# Patient Record
Sex: Male | Born: 1985 | Race: White | Hispanic: No | Marital: Single | Smoking: Current every day smoker
Health system: Southern US, Community
[De-identification: ages and names within clinical notes are randomized; demographics above are authoritative.]

---

## 2019-05-07 ENCOUNTER — Inpatient Hospital Stay (HOSPITAL_COMMUNITY)
Admission: EM | Admit: 2019-05-07 | Discharge: 2019-05-22 | DRG: 394 | Attending: Internal Medicine | Admitting: Internal Medicine

## 2019-05-07 ENCOUNTER — Emergency Department (HOSPITAL_COMMUNITY)

## 2019-05-07 ENCOUNTER — Encounter (HOSPITAL_COMMUNITY): Payer: Self-pay | Admitting: Emergency Medicine

## 2019-05-07 ENCOUNTER — Inpatient Hospital Stay (HOSPITAL_COMMUNITY)

## 2019-05-07 ENCOUNTER — Encounter (HOSPITAL_COMMUNITY): Admission: EM | Payer: Self-pay | Attending: Internal Medicine

## 2019-05-07 ENCOUNTER — Emergency Department (HOSPITAL_COMMUNITY): Admitting: Registered Nurse

## 2019-05-07 ENCOUNTER — Other Ambulatory Visit: Payer: Self-pay

## 2019-05-07 DIAGNOSIS — X58XXXA Exposure to other specified factors, initial encounter: Secondary | ICD-10-CM | POA: Diagnosis present

## 2019-05-07 DIAGNOSIS — K5900 Constipation, unspecified: Secondary | ICD-10-CM | POA: Diagnosis present

## 2019-05-07 DIAGNOSIS — F515 Nightmare disorder: Secondary | ICD-10-CM | POA: Diagnosis present

## 2019-05-07 DIAGNOSIS — Z781 Physical restraint status: Secondary | ICD-10-CM | POA: Diagnosis not present

## 2019-05-07 DIAGNOSIS — Z915 Personal history of self-harm: Secondary | ICD-10-CM

## 2019-05-07 DIAGNOSIS — F17210 Nicotine dependence, cigarettes, uncomplicated: Secondary | ICD-10-CM | POA: Diagnosis present

## 2019-05-07 DIAGNOSIS — S51812A Laceration without foreign body of left forearm, initial encounter: Secondary | ICD-10-CM | POA: Diagnosis present

## 2019-05-07 DIAGNOSIS — T1491XA Suicide attempt, initial encounter: Secondary | ICD-10-CM | POA: Diagnosis not present

## 2019-05-07 DIAGNOSIS — T184XXA Foreign body in colon, initial encounter: Principal | ICD-10-CM | POA: Diagnosis present

## 2019-05-07 DIAGNOSIS — Z62810 Personal history of physical and sexual abuse in childhood: Secondary | ICD-10-CM | POA: Diagnosis present

## 2019-05-07 DIAGNOSIS — F4310 Post-traumatic stress disorder, unspecified: Secondary | ICD-10-CM | POA: Diagnosis present

## 2019-05-07 DIAGNOSIS — Y92149 Unspecified place in prison as the place of occurrence of the external cause: Secondary | ICD-10-CM | POA: Diagnosis not present

## 2019-05-07 DIAGNOSIS — B1920 Unspecified viral hepatitis C without hepatic coma: Secondary | ICD-10-CM | POA: Diagnosis present

## 2019-05-07 DIAGNOSIS — F331 Major depressive disorder, recurrent, moderate: Secondary | ICD-10-CM | POA: Diagnosis present

## 2019-05-07 DIAGNOSIS — Z20828 Contact with and (suspected) exposure to other viral communicable diseases: Secondary | ICD-10-CM | POA: Diagnosis present

## 2019-05-07 DIAGNOSIS — T17908S Unspecified foreign body in respiratory tract, part unspecified causing other injury, sequela: Secondary | ICD-10-CM

## 2019-05-07 DIAGNOSIS — X788XXA Intentional self-harm by other sharp object, initial encounter: Secondary | ICD-10-CM | POA: Diagnosis present

## 2019-05-07 DIAGNOSIS — R45851 Suicidal ideations: Secondary | ICD-10-CM | POA: Diagnosis present

## 2019-05-07 DIAGNOSIS — F4312 Post-traumatic stress disorder, chronic: Secondary | ICD-10-CM | POA: Diagnosis not present

## 2019-05-07 DIAGNOSIS — T17900S Unspecified foreign body in respiratory tract, part unspecified causing asphyxiation, sequela: Secondary | ICD-10-CM

## 2019-05-07 DIAGNOSIS — T189XXD Foreign body of alimentary tract, part unspecified, subsequent encounter: Secondary | ICD-10-CM | POA: Diagnosis not present

## 2019-05-07 DIAGNOSIS — Z880 Allergy status to penicillin: Secondary | ICD-10-CM

## 2019-05-07 DIAGNOSIS — T189XXA Foreign body of alimentary tract, part unspecified, initial encounter: Secondary | ICD-10-CM | POA: Diagnosis present

## 2019-05-07 LAB — CBC
HCT: 49.7 % (ref 39.0–52.0)
Hemoglobin: 16.2 g/dL (ref 13.0–17.0)
MCH: 30.1 pg (ref 26.0–34.0)
MCHC: 32.6 g/dL (ref 30.0–36.0)
MCV: 92.2 fL (ref 80.0–100.0)
Platelets: 311 10*3/uL (ref 150–400)
RBC: 5.39 MIL/uL (ref 4.22–5.81)
RDW: 13 % (ref 11.5–15.5)
WBC: 9.4 10*3/uL (ref 4.0–10.5)
nRBC: 0 % (ref 0.0–0.2)

## 2019-05-07 LAB — RAPID URINE DRUG SCREEN, HOSP PERFORMED
Amphetamines: NOT DETECTED
Barbiturates: NOT DETECTED
Benzodiazepines: NOT DETECTED
Cocaine: NOT DETECTED
Opiates: NOT DETECTED
Tetrahydrocannabinol: NOT DETECTED

## 2019-05-07 LAB — COMPREHENSIVE METABOLIC PANEL
ALT: 11 U/L (ref 0–44)
AST: 15 U/L (ref 15–41)
Albumin: 4.4 g/dL (ref 3.5–5.0)
Alkaline Phosphatase: 67 U/L (ref 38–126)
Anion gap: 8 (ref 5–15)
BUN: 7 mg/dL (ref 6–20)
CO2: 29 mmol/L (ref 22–32)
Calcium: 8.9 mg/dL (ref 8.9–10.3)
Chloride: 103 mmol/L (ref 98–111)
Creatinine, Ser: 0.84 mg/dL (ref 0.61–1.24)
GFR calc Af Amer: >60 mL/min (ref >60)
GFR calc non Af Amer: >60 mL/min (ref >60)
Glucose, Bld: 102 mg/dL — ABNORMAL HIGH (ref 70–99)
Potassium: 4.5 mmol/L (ref 3.5–5.1)
Sodium: 140 mmol/L (ref 135–145)
Total Bilirubin: 1 mg/dL (ref 0.3–1.2)
Total Protein: 7.2 g/dL (ref 6.5–8.1)

## 2019-05-07 LAB — ETHANOL: Alcohol, Ethyl (B): <10 mg/dL (ref <10)

## 2019-05-07 LAB — HIV ANTIBODY (ROUTINE TESTING W REFLEX): HIV Screen 4th Generation wRfx: NONREACTIVE

## 2019-05-07 LAB — SALICYLATE LEVEL: Salicylate Lvl: <7 mg/dL (ref 2.8–30.0)

## 2019-05-07 LAB — ACETAMINOPHEN LEVEL: Acetaminophen (Tylenol), Serum: <10 ug/mL — ABNORMAL LOW (ref 10–30)

## 2019-05-07 LAB — SARS CORONAVIRUS 2 BY RT PCR (HOSPITAL ORDER, PERFORMED IN ~~LOC~~ HOSPITAL LAB): SARS Coronavirus 2: NEGATIVE

## 2019-05-07 SURGERY — CANCELLED PROCEDURE
Laterality: Left

## 2019-05-07 MED ORDER — LACTULOSE 10 GM/15ML PO SOLN: 30.0000 g | Freq: Once | ORAL | Status: DC

## 2019-05-07 MED ORDER — POLYETHYLENE GLYCOL 3350 17 G PO PACK
17.0000 g | PACK | Freq: Once | ORAL | Status: AC
  Administered 2019-05-08: 17 g via ORAL
  Filled 2019-05-07 (×3): qty 1

## 2019-05-07 MED ORDER — OXYCODONE HCL 5 MG PO TABS
5.0000 mg | ORAL_TABLET | ORAL | Status: DC | PRN
  Administered 2019-05-08 – 2019-05-22 (×32): 5 mg via ORAL
  Filled 2019-05-07 (×36): qty 1

## 2019-05-07 MED ORDER — BISACODYL 5 MG PO TBEC: 10.0000 mg | DELAYED_RELEASE_TABLET | Freq: Every day | ORAL | Status: DC

## 2019-05-07 MED ORDER — HYDROMORPHONE HCL 1 MG/ML IJ SOLN
0.5000 mg | Freq: Once | INTRAMUSCULAR | Status: AC
  Administered 2019-05-07: 0.5 mg via INTRAVENOUS
  Filled 2019-05-07: qty 1

## 2019-05-07 MED ORDER — SODIUM CHLORIDE 0.9 % IV SOLN
INTRAVENOUS | Status: DC
  Administered 2019-05-07 – 2019-05-14 (×8): via INTRAVENOUS

## 2019-05-07 MED ORDER — KETOROLAC TROMETHAMINE 15 MG/ML IJ SOLN
15.0000 mg | Freq: Four times a day (QID) | INTRAMUSCULAR | Status: AC | PRN
  Administered 2019-05-07 – 2019-05-12 (×5): 15 mg via INTRAVENOUS
  Filled 2019-05-07 (×5): qty 1

## 2019-05-07 SURGICAL SUPPLY — 14 items

## 2019-05-07 NOTE — H&P (Signed)
History and Physical    Christopher Stephenson OXB:[id number] DOB: 12-11-1985 DOA: 05/07/2019  PCP: Patient, No Pcp Per Patient coming from: [place]  Chief Complaint: Ingested razor blades  HPI: Christopher Stephenson is a 33 y.o. male with medical history significant of hepatitis C was brought in by law enforcement for in tensional ingestion of razor blades.  He did this to harm himself he has had multiple suicide attempts previously.  He feels complaints of depression and he just wanted to kill himself.  He has had hospital admissions for similar presentation.  He swallowed 4 razor blades this afternoon that were in the pack.  He has not been eating or drinking much in an attempt to kill himself.  He had a bowel movement today he has some abdominal pain he denies any nausea or vomiting.  He denies urinary complaints chest pain shortness of breath fever chills cough.    ED Course: Rapid Covid test was ordered. ED physician spoke with Dr. Ricky Stabs and Dr. Dulce Sellar. Dr. Dulce Sellar recommended to repeat abdominal x-ray around 7 PM today to confirm where the razor blade is.  He seems to think since he ingested razor blades around 1 PM the blame blade may have already passed the pylorus.Dr. Ricky Stabs recommended laxatives His labs in the ED are unremarkable alcohol level less than 10 salicylate less than 7 CBC and BMP is unremarkable. KUB 3 linear metallic densities in the abdomen 1 of which projects over the stomach and 2 other in the left lower quadrant likely within the bowel.  No evidence of free air. Chest x-ray no acute process   Review of Systems: As per HPI otherwise all other systems reviewed and are negative  Ambulatory Status: Ambulatory at baseline  History reviewed. No pertinent past medical history.  History reviewed. No pertinent surgical history.  Social History   Socioeconomic History  . Marital status: Single    Spouse name: Not on file  . Number of children: Not on file  . Years of education: Not on  file  . Highest education level: Not on file  Occupational History  . Not on file  Social Needs  . Financial resource strain: Not on file  . Food insecurity    Worry: Not on file    Inability: Not on file  . Transportation needs    Medical: Not on file    Non-medical: Not on file  Tobacco Use  . Smoking status: Not on file  Substance and Sexual Activity  . Alcohol use: Not on file  . Drug use: Not on file  . Sexual activity: Not on file  Lifestyle  . Physical activity    Days per week: Not on file    Minutes per session: Not on file  . Stress: Not on file  Relationships  . Social Musician on phone: Not on file    Gets together: Not on file    Attends religious service: Not on file    Active member of club or organization: Not on file    Attends meetings of clubs or organizations: Not on file    Relationship status: Not on file  . Intimate partner violence    Fear of current or ex partner: Not on file    Emotionally abused: Not on file    Physically abused: Not on file    Forced sexual activity: Not on file  Other Topics Concern  . Not on file  Social History Narrative  . Not on  file    Allergies  Allergen Reactions  . Penicillins Other (See Comments)    Unsure Did it involve swelling of the face/tongue/throat, SOB, or low BP? unknown Did it involve sudden or severe rash/hives, skin peeling, or any reaction on the inside of your mouth or nose? unknown Did you need to seek medical attention at a hospital or doctor's office? unknown When did it last happen?childhood allergy If all above answers are "NO", may proceed with cephalosporin use.     No family history on file.    Prior to Admission medications   Not on File    Physical Exam: Vitals:   05/07/19 1449  BP: (!) 146/101  Pulse: 72  Resp: 18  Temp: 98 F (36.7 C)  TempSrc: Oral  SpO2: 99%     . General: Appears calm and comfortable . Eyes:  PERRL, EOMI, normal lids, iris .  ENT:  grossly normal hearing, lips & tongue, mmm . Neck:  no LAD, masses or thyromegaly . Cardiovascular:  RRR, no m/r/g. No LE edema.  Marland Kitchen. Respiratory:  CTA bilaterally, no w/r/r. Normal respiratory effort. . Abdomen:  soft, nd, NABS mild tenderness . Skin:  no rash or induration seen on limited exam . Musculoskeletal:  grossly normal tone BUE/BLE, good ROM, no bony abnormality . Psychiatric:  grossly normal mood and affect, speech fluent and appropriate, AOx3 . Neurologic:  CN 2-12 grossly intact, moves all extremities in coordinated fashion, sensation intact  Labs on Admission: I have personally reviewed following labs and imaging studies  CBC: Recent Labs  Lab 05/07/19 1548  WBC 9.4  HGB 16.2  HCT 49.7  MCV 92.2  PLT 311   Basic Metabolic Panel: Recent Labs  Lab 05/07/19 1548  NA 140  K 4.5  CL 103  CO2 29  GLUCOSE 102*  BUN 7  CREATININE 0.84  CALCIUM 8.9   GFR: CrCl cannot be calculated (Unknown ideal weight.). Liver Function Tests: Recent Labs  Lab 05/07/19 1548  AST 15  ALT 11  ALKPHOS 67  BILITOT 1.0  PROT 7.2  ALBUMIN 4.4   No results for input(s): LIPASE, AMYLASE in the last 168 hours. No results for input(s): AMMONIA in the last 168 hours. Coagulation Profile: No results for input(s): INR, PROTIME in the last 168 hours. Cardiac Enzymes: No results for input(s): CKTOTAL, CKMB, CKMBINDEX, TROPONINI in the last 168 hours. BNP (last 3 results) No results for input(s): PROBNP in the last 8760 hours. HbA1C: No results for input(s): HGBA1C in the last 72 hours. CBG: No results for input(s): GLUCAP in the last 168 hours. Lipid Profile: No results for input(s): CHOL, HDL, LDLCALC, TRIG, CHOLHDL, LDLDIRECT in the last 72 hours. Thyroid Function Tests: No results for input(s): TSH, T4TOTAL, FREET4, T3FREE, THYROIDAB in the last 72 hours. Anemia Panel: No results for input(s): VITAMINB12, FOLATE, FERRITIN, TIBC, IRON, RETICCTPCT in the last 72 hours.  Urine analysis: No results found for: COLORURINE, APPEARANCEUR, LABSPEC, PHURINE, GLUCOSEU, HGBUR, BILIRUBINUR, KETONESUR, PROTEINUR, UROBILINOGEN, NITRITE, LEUKOCYTESUR  Creatinine Clearance: CrCl cannot be calculated (Unknown ideal weight.).  Sepsis Labs: @LABRCNTIP (procalcitonin:4,lacticidven:4) )No results found for this or any previous visit (from the past 240 hour(s)).   Radiological Exams on Admission: Dg Chest 2 View  Result Date: 05/07/2019 CLINICAL DATA:  Patient swallowed a razor blade of approximately 4 pieces today at approximately 12 or 1 p.m. EXAM: CHEST - 2 VIEW COMPARISON:  None. FINDINGS: The heart size and mediastinal contours are within normal limits. The lungs are clear.  No pneumothorax or pleural effusion. No radiopaque foreign body in the chest. The visualized skeletal structures are unremarkable. IMPRESSION: No active cardiopulmonary disease. No radiopaque foreign body in the chest. Electronically Signed   By: Audie Pinto M.D.   On: 05/07/2019 15:44   Dg Abdomen 1 View  Result Date: 05/07/2019 CLINICAL DATA:  Patient swallowed a razor blade of approximately 4 pieces today at approximately 12 or 1 p.m. EXAM: ABDOMEN - 1 VIEW COMPARISON:  None. FINDINGS: There are 3 linear metallic densities in the abdomen, 1 of which projects over the stomach and the other 2 in the left lower quadrant, likely within bowel which most likely represent the ingested contents. There are no dilated loops of bowel. No evidence of free air. No acute finding in the visualized skeleton. IMPRESSION: 3 linear metallic densities in the abdomen, 1 of which projects over the stomach and the other 2 in the left lower quadrant, likely within bowel. These most likely represent the ingested razor blade pieces. No evidence of free air. Electronically Signed   By: Audie Pinto M.D.   On: 05/07/2019 15:47     Assessment/Plan Active Problems:   Ingestion of foreign body  #1 ingestion of razor  blades intentionally to harm himself-admit to MedSurg, start IV fluids, n.p.o. ED physician has consulted Dr. Paulita Fujita and Dr. Harlow Asa Will do KUB today around 7 PM to see where the foreign body is.  #2 depression suicidal intention patient IVC is in the ER.  Will need psych consult once medically cleared.   There is no height or weight on file to calculate BMI.   DVT prophylaxis SCD as patient might need surgery or EGD Code Status: Full code Family Communication: None Disposition Plan: Pending clinical improvement Consults called: GI and general surgery Admission status: Inpatient   Georgette Shell MD [facility]  If 7PM-7AM, please contact night-coverage [email or web address] Password [facility]  05/07/2019, 5:56 PM

## 2019-05-07 NOTE — ED Notes (Signed)
ED TO INPATIENT HANDOFF REPORT  ED Nurse Name and Phone #:  Sahana Boyland [phone]  S Name/Age/Gender Christopher Stephenson 33 y.o. male Room/Bed: WA31/WA31  Code Status   Code Status: Full Code  Home/SNF/Other JAIL Patient oriented to: self, place, time and situation Is this baseline? Yes   Triage Complete: Triage complete  Chief Complaint Ingested Razor Blade / Laceration on Forearm   Triage Note No notes on file   Allergies Allergies  Allergen Reactions  . Penicillins Other (See Comments)    Unsure Did it involve swelling of the face/tongue/throat, SOB, or low BP? unknown Did it involve sudden or severe rash/hives, skin peeling, or any reaction on the inside of your mouth or nose? unknown Did you need to seek medical attention at a hospital or doctor's office? unknown When did it last happen?childhood allergy If all above answers are "NO", may proceed with cephalosporin use.     Level of Care/Admitting Diagnosis ED Disposition    ED Disposition Condition Comment   Admit  Hospital Area: [facility] [[id number]]  Level of Care: Med-Surg [16]  Covid Evaluation: Asymptomatic Screening Protocol (No Symptoms)  Diagnosis: Ingestion of foreign body [708996]  Admitting Physician: Alwyn Ren [[id number]]  Attending Physician: Alwyn Ren [[id number]]  Estimated length of stay: past midnight tomorrow  Certification:: I certify this patient will need inpatient services for at least 2 midnights  PT Class (Do Not Modify): Inpatient [101]  PT Acc Code (Do Not Modify): Private [1]       B Medical/Surgery History History reviewed. No pertinent past medical history. History reviewed. No pertinent surgical history.   A IV Location/Drains/Wounds Patient Lines/Drains/Airways Status   Active Line/Drains/Airways    Name:   Placement date:   Placement time:   Site:   Days:   Peripheral IV 05/07/19 Right Antecubital   05/07/19    1818    Antecubital   less  than 1          Intake/Output Last 24 hours No intake or output data in the 24 hours ending 05/07/19 1845  Labs/Imaging Results for orders placed or performed during the hospital encounter of 05/07/19 (from the past 48 hour(s))  Comprehensive metabolic panel     Status: Abnormal   Collection Time: 05/07/19  3:48 PM  Result Value Ref Range   Sodium 140 135 - 145 mmol/L   Potassium 4.5 3.5 - 5.1 mmol/L   Chloride 103 98 - 111 mmol/L   CO2 29 22 - 32 mmol/L   Glucose, Bld 102 (H) 70 - 99 mg/dL   BUN 7 6 - 20 mg/dL   Creatinine, Ser 0.62 0.61 - 1.24 mg/dL   Calcium 8.9 8.9 - 37.6 mg/dL   Total Protein 7.2 6.5 - 8.1 g/dL   Albumin 4.4 3.5 - 5.0 g/dL   AST 15 15 - 41 U/L   ALT 11 0 - 44 U/L   Alkaline Phosphatase 67 38 - 126 U/L   Total Bilirubin 1.0 0.3 - 1.2 mg/dL   GFR calc non Af Amer >60 >60 mL/min   GFR calc Af Amer >60 >60 mL/min   Anion gap 8 5 - 15    Comment: Performed at [facility], [address], [place], [place] [address]  Ethanol     Status: None   Collection Time: 05/07/19  3:48 PM  Result Value Ref Range   Alcohol, Ethyl (B) <10 <10 mg/dL    Comment: (NOTE) Lowest detectable limit for  serum alcohol is 10 mg/dL. For medical purposes only. Performed at [facility], [address] [place], [place] [address]   Salicylate level     Status: None   Collection Time: 05/07/19  3:48 PM  Result Value Ref Range   Salicylate Lvl <7.0 2.8 - 30.0 mg/dL    Comment: Performed at [facility], [address] [place], [place] [address]  Acetaminophen level     Status: Abnormal   Collection Time: 05/07/19  3:48 PM  Result Value Ref Range   Acetaminophen (Tylenol), Serum <10 (L) 10 - 30 ug/mL    Comment: (NOTE) Therapeutic concentrations vary significantly. A range of 10-30 ug/mL  may be an effective concentration for many patients. However, some  are best treated at concentrations outside of this  range. Acetaminophen concentrations >150 ug/mL at 4 hours after ingestion  and >50 ug/mL at 12 hours after ingestion are often associated with  toxic reactions. Performed at [facility], [address] [place], [place] [address]   cbc     Status: None   Collection Time: 05/07/19  3:48 PM  Result Value Ref Range   WBC 9.4 4.0 - 10.5 K/uL   RBC 5.39 4.22 - 5.81 MIL/uL   Hemoglobin 16.2 13.0 - 17.0 g/dL   HCT 91.449.7 78.239.0 - 95.652.0 %   MCV 92.2 80.0 - 100.0 fL   MCH 30.1 26.0 - 34.0 pg   MCHC 32.6 30.0 - 36.0 g/dL   RDW 21.313.0 08.611.5 - 57.815.5 %   Platelets 311 150 - 400 K/uL   nRBC 0.0 0.0 - 0.2 %    Comment: Performed at [facility], [address] [place], [place] [address]  Rapid urine drug screen (hospital performed)     Status: None   Collection Time: 05/07/19  3:48 PM  Result Value Ref Range   Opiates NONE DETECTED NONE DETECTED   Cocaine NONE DETECTED NONE DETECTED   Benzodiazepines NONE DETECTED NONE DETECTED   Amphetamines NONE DETECTED NONE DETECTED   Tetrahydrocannabinol NONE DETECTED NONE DETECTED   Barbiturates NONE DETECTED NONE DETECTED    Comment: (NOTE) DRUG SCREEN FOR MEDICAL PURPOSES ONLY.  IF CONFIRMATION IS NEEDED FOR ANY PURPOSE, NOTIFY LAB WITHIN 5 DAYS. LOWEST DETECTABLE LIMITS FOR URINE DRUG SCREEN Drug Class                     Cutoff (ng/mL) Amphetamine and metabolites    1000 Barbiturate and metabolites    200 Benzodiazepine                 200 Tricyclics and metabolites     300 Opiates and metabolites        300 Cocaine and metabolites        300 THC                            50 Performed at [facility], [address] [place], [place] [address]   SARS Coronavirus 2 by RT PCR (hospital order, performed in [facility] hospital lab) Nasopharyngeal Nasopharyngeal Swab     Status: None   Collection Time: 05/07/19  5:44 PM   Specimen: Nasopharyngeal Swab  Result Value Ref Range   SARS  Coronavirus 2 NEGATIVE NEGATIVE    Comment: (NOTE) If result is NEGATIVE SARS-CoV-2 target nucleic acids are NOT DETECTED. The SARS-CoV-2 RNA is generally detectable in upper and  lower  respiratory specimens during the acute phase of infection. The lowest  concentration of SARS-CoV-2 viral copies this assay can detect is 250  copies / mL. A negative result does not preclude SARS-CoV-2 infection  and should not be used as the sole basis for treatment or other  patient management decisions.  A negative result may occur with  improper specimen collection / handling, submission of specimen other  than nasopharyngeal swab, presence of viral mutation(s) within the  areas targeted by this assay, and inadequate number of viral copies  (<250 copies / mL). A negative result must be combined with clinical  observations, patient history, and epidemiological information. If result is POSITIVE SARS-CoV-2 target nucleic acids are DETECTED. The SARS-CoV-2 RNA is generally detectable in upper and lower  respiratory specimens dur ing the acute phase of infection.  Positive  results are indicative of active infection with SARS-CoV-2.  Clinical  correlation with patient history and other diagnostic information is  necessary to determine patient infection status.  Positive results do  not rule out bacterial infection or co-infection with other viruses. If result is PRESUMPTIVE POSTIVE SARS-CoV-2 nucleic acids MAY BE PRESENT.   A presumptive positive result was obtained on the submitted specimen  and confirmed on repeat testing.  While 2019 novel coronavirus  (SARS-CoV-2) nucleic acids may be present in the submitted sample  additional confirmatory testing may be necessary for epidemiological  and / or clinical management purposes  to differentiate between  SARS-CoV-2 and other Sarbecovirus currently known to infect humans.  If clinically indicated additional testing with an alternate test  methodology  [phone]) is advised. The SARS-CoV-2 RNA is generally  detectable in upper and lower respiratory sp ecimens during the acute  phase of infection. The expected result is Negative. Fact Sheet for Patients:  BoilerBrush.com.cy Fact Sheet for Healthcare Providers: https://pope.com/ This test is not yet approved or cleared by the [place] FDA and has been authorized for detection and/or diagnosis of SARS-CoV-2 by FDA under an Emergency Use Authorization (EUA).  This EUA will remain in effect (meaning this test can be used) for the duration of the COVID-19 declaration under Section 564(b)(1) of the Act, 21 U.S.C. section 360bbb-3(b)(1), unless the authorization is terminated or revoked sooner. Performed at [facility], [address], [place], [place] [address]    Dg Chest 2 View  Result Date: 05/07/2019 CLINICAL DATA:  Patient swallowed a razor blade of approximately 4 pieces today at approximately 12 or 1 p.m. EXAM: CHEST - 2 VIEW COMPARISON:  None. FINDINGS: The heart size and mediastinal contours are within normal limits. The lungs are clear. No pneumothorax or pleural effusion. No radiopaque foreign body in the chest. The visualized skeletal structures are unremarkable. IMPRESSION: No active cardiopulmonary disease. No radiopaque foreign body in the chest. Electronically Signed   By: Emmaline Kluver M.D.   On: 05/07/2019 15:44   Dg Abdomen 1 View  Result Date: 05/07/2019 CLINICAL DATA:  Patient swallowed a razor blade of approximately 4 pieces today at approximately 12 or 1 p.m. EXAM: ABDOMEN - 1 VIEW COMPARISON:  None. FINDINGS: There are 3 linear metallic densities in the abdomen, 1 of which projects over the stomach and the other 2 in the left lower quadrant, likely within bowel which most likely represent the ingested contents. There are no dilated loops of bowel. No evidence of free air. No acute finding in the  visualized skeleton. IMPRESSION: 3 linear metallic densities in the abdomen, 1 of which  projects over the stomach and the other 2 in the left lower quadrant, likely within bowel. These most likely represent the ingested razor blade pieces. No evidence of free air. Electronically Signed   By: Audie Pinto M.D.   On: 05/07/2019 15:47    Pending Labs Unresulted Labs (From admission, onward)    Start     Ordered   05/08/19 0500  Comprehensive metabolic panel  Tomorrow morning,   R     05/07/19 1755   05/08/19 0500  CBC  Tomorrow morning,   R     05/07/19 1755   05/07/19 1755  HIV Antibody (routine testing w rflx)  (HIV Antibody (Routine testing w reflex) panel)  Once,   STAT     05/07/19 1755          Vitals/Pain Today's Vitals   05/07/19 1449 05/07/19 1451  BP: (!) 146/101   Pulse: 72   Resp: 18   Temp: 98 F (36.7 C)   TempSrc: Oral   SpO2: 99%   PainSc:  0-No pain    Isolation Precautions No active isolations  Medications Medications  0.9 %  sodium chloride infusion ( Intravenous New Bag/Given 05/07/19 1825)  polyethylene glycol (MIRALAX / GLYCOLAX) packet 17 g (has no administration in time range)  HYDROmorphone (DILAUDID) injection 0.5 mg (0.5 mg Intravenous Given 05/07/19 1842)    Mobility walks Low fall risk   Focused Assessments    R Recommendations: See Admitting Provider Note  Report given to:   Additional Notes:  Do not give miralax prior to surgery. Only give if endo opts for no surgery *

## 2019-05-07 NOTE — Plan of Care (Signed)
[facility] was called about foreign body ingestion in patient.  Endoscopy staff mobilized pending results of COVID testing.  By time COVID testing complete (which is mandatory for all endoscopic procedures), the foreign body in the stomach has subsequently passed into at least the mid-to-distal small bowel, thus endoscopy not warranted.  Advise gentle bowel regimen with miralax and repeat xray tomorrow am.  Patient will need to remain under strict surveillance and restraints, as he is at very high risk for reattempts to swallow further foreign bodies.  [facility] will revisit tomorrow.

## 2019-05-07 NOTE — Progress Notes (Addendum)
Pt refusing to drink Miralax stating that it, "Defeats the purpose of not eating and drinking over the last few days and trying to starve myself."   Also documentation of patient's refusal of blood products if needed documented in the [place].

## 2019-05-07 NOTE — ED Provider Notes (Signed)
[facility]-EMERGENCY DEPT Provider Note   CSN: [id number] Arrival date & time: 05/07/19  1441     History   Chief Complaint Chief Complaint  Patient presents with   Ingestion    HPI Christopher Stephenson is a 33 y.o. male with history of hepatitis C presents from prison accompanied by law enforcement for evaluation after intentional ingestion of razor blades.  He tells me that he is depressed and "I am done, 1 way or another I am done ".  He tells me that he has had 50+ suicide attempts previously.  Per chart review the patient had been admitted to an outside hospital in 2015 for similar presentation.  He tells me around 1 PM today he swallowed 3 or 4 razor blades that were in a pack.  Reports that he has not been eating or drinking as much as well in an attempt to hurt himself stating "if the razor blades do not do it, I figured not eating might".  Denies homicidal ideation or auditory or visual hallucinations.  He notes some right-sided cramping abdominal pain that began around 1 hour ago.  Denies chest pain, shortness of breath, nausea, vomiting, urinary symptoms, diarrhea, or constipation.  He has not had a bowel movement since yesterday.     The history is provided by the patient.    History reviewed. No pertinent past medical history.  Patient Active Problem List   Diagnosis Date Noted   Ingestion of foreign body 05/07/2019    History reviewed. No pertinent surgical history.      Home Medications    Prior to Admission medications   Not on File    Family History No family history on file.  Social History Social History   Tobacco Use   Smoking status: Not on file  Substance Use Topics   Alcohol use: Not on file   Drug use: Not on file     Allergies   Penicillins   Review of Systems Review of Systems  Constitutional: Negative for chills and fever.  Respiratory: Negative for shortness of breath.   Cardiovascular: Negative for chest  pain.  Gastrointestinal: Positive for abdominal pain. Negative for blood in stool, constipation, diarrhea, nausea and vomiting.  Psychiatric/Behavioral: Positive for dysphoric mood and suicidal ideas.  All other systems reviewed and are negative.    Physical Exam Updated Vital Signs BP 127/79 (BP Location: Right Arm)    Pulse 75    Temp 98.1 F (36.7 C) (Oral)    Resp 19    SpO2 99%   Physical Exam Vitals signs and nursing note reviewed.  Constitutional:      General: He is not in acute distress.    Appearance: He is well-developed.  HENT:     Head: Normocephalic and atraumatic.  Eyes:     General:        Right eye: No discharge.        Left eye: No discharge.     Conjunctiva/sclera: Conjunctivae normal.  Neck:     Vascular: No JVD.     Trachea: No tracheal deviation.  Cardiovascular:     Rate and Rhythm: Normal rate and regular rhythm.  Pulmonary:     Effort: Pulmonary effort is normal.     Breath sounds: Normal breath sounds.  Abdominal:     General: Abdomen is flat. Bowel sounds are normal. There is no distension.     Palpations: Abdomen is soft.     Tenderness: There is abdominal tenderness  in the periumbilical area. There is no guarding or rebound.  Skin:    General: Skin is warm and dry.     Findings: No erythema.     Comments: Multiple superficial linear lacerations to the volar aspect of the left forearm appear fresh.  No active bleeding, no subcutaneous tissue noted.  Neurological:     Mental Status: He is alert.  Psychiatric:        Attention and Perception: Attention normal.        Mood and Affect: Affect is flat.        Speech: Speech normal.        Behavior: Behavior is withdrawn. Behavior is cooperative.        Thought Content: Thought content includes suicidal ideation. Thought content does not include homicidal ideation. Thought content includes suicidal plan. Thought content does not include homicidal plan.        Judgment: Judgment is impulsive and  inappropriate.      ED Treatments / Results  Labs (all labs ordered are listed, but only abnormal results are displayed) Labs Reviewed  COMPREHENSIVE METABOLIC PANEL - Abnormal; Notable for the following components:      Result Value   Glucose, Bld 102 (*)    All other components within normal limits  ACETAMINOPHEN LEVEL - Abnormal; Notable for the following components:   Acetaminophen (Tylenol), Serum <10 (*)    All other components within normal limits  SARS CORONAVIRUS 2 BY RT PCR (HOSPITAL ORDER, PERFORMED IN [facility])  MRSA PCR SCREENING  ETHANOL  SALICYLATE LEVEL  CBC  RAPID URINE DRUG SCREEN, HOSP PERFORMED  HIV ANTIBODY (ROUTINE TESTING W REFLEX)  COMPREHENSIVE METABOLIC PANEL  CBC    EKG None  Radiology Dg Chest 2 View  Result Date: 05/07/2019 CLINICAL DATA:  Patient swallowed a razor blade of approximately 4 pieces today at approximately 12 or 1 p.m. EXAM: CHEST - 2 VIEW COMPARISON:  None. FINDINGS: The heart size and mediastinal contours are within normal limits. The lungs are clear. No pneumothorax or pleural effusion. No radiopaque foreign body in the chest. The visualized skeletal structures are unremarkable. IMPRESSION: No active cardiopulmonary disease. No radiopaque foreign body in the chest. Electronically Signed   By: Emmaline Kluver M.D.   On: 05/07/2019 15:44   Dg Abdomen 1 View  Result Date: 05/07/2019 CLINICAL DATA:  Patient swallowed a razor blade of approximately 4 pieces today at approximately 12 or 1 p.m. EXAM: ABDOMEN - 1 VIEW COMPARISON:  None. FINDINGS: There are 3 linear metallic densities in the abdomen, 1 of which projects over the stomach and the other 2 in the left lower quadrant, likely within bowel which most likely represent the ingested contents. There are no dilated loops of bowel. No evidence of free air. No acute finding in the visualized skeleton. IMPRESSION: 3 linear metallic densities in the abdomen, 1 of which  projects over the stomach and the other 2 in the left lower quadrant, likely within bowel. These most likely represent the ingested razor blade pieces. No evidence of free air. Electronically Signed   By: Emmaline Kluver M.D.   On: 05/07/2019 15:47    Procedures Procedures (including critical care time)  Medications Ordered in ED Medications  0.9 %  sodium chloride infusion ( Intravenous New Bag/Given 05/07/19 1825)  oxyCODONE (Oxy IR/ROXICODONE) immediate release tablet 5 mg (has no administration in time range)  ketorolac (TORADOL) 15 MG/ML injection 15 mg (15 mg Intravenous Given 05/07/19 2041)  polyethylene glycol (MIRALAX / GLYCOLAX) packet 17 g (17 g Oral Given 05/07/19 2042)  HYDROmorphone (DILAUDID) injection 0.5 mg (0.5 mg Intravenous Given 05/07/19 1842)     Initial Impression / Assessment and Plan / ED Course  I have reviewed the triage vital signs and the nursing notes.  Pertinent labs & imaging results that were available during my care of the patient were reviewed by me and considered in my medical decision making (see chart for details).        Patient presenting to the ED brought in by law enforcement after reportedly swallowing razor blades at 1 PM.  He is afebrile, vital signs are stable.  He is nontoxic in appearance.  He is verbalizing clear intent to harm himself and states that he is starving himself and swallowed the razor blades in an attempt to commit suicide.  Abdomen soft, mild right lower quadrant tenderness.  Lab work reviewed by me shows no leukocytosis, no anemia, no metabolic derangements, no renal insufficiency.  Plain films of the chest and abdomen show 3 linear metallic densities in the abdomen 1 of which project over the stomach and the other 2 in the left lower quadrant likely within the bowel which likely represent swallowed razor blades.  4:18 PM CONSULT: Spoke with Dr. Harlow Asa with general surgery. Recommends GI consult for further recommendations as  they may want to do EGD to remove the bleed that apparently is in the stomach; advises there is nothing to do from a surgical standpoint but surgery can be consulted if the patient develops acute abdomen.  May want to give laxatives to help the razor blades pass.  4:30PM CONSULT: Spoke with Dr. Paulita Fujita with GI who recommends obtaining rapid Covid test so that the patient can undergo EGD for the razor blade that appears to be in the stomach.  He does recommend admission to the hospitalist service for observation to ensure that the razor blades that are already within the bowel pass.  Spoke with Dr. Johnnye Sima with infectious disease who approves obtaining rapid Covid for preprocedural screening.  5:51 PM Dr. Paulita Fujita called to follow-up on the status of the patient's rapid Covid test.  It is currently in process.  Since patient reportedly ingested the razor blades around 1 PM today, the blade may have already passed the pylorus.  He recommends getting repeat abdominal x-ray at around 7 PM today to confirm placement; will plan to move forward with endoscopy if it is still present on repeat x-ray.  Spoke with Dr. Sid Falcon or with [facility] hospitalist service who agrees to assume care patient and bring the patient into the hospital for further evaluation and management.  Will place patient under IVC for his clear suicidal intent and attempt.  Once he is medically cleared he should undergo psychiatric assessment.  Final Clinical Impressions(s) / ED Diagnoses   Final diagnoses:  Intentional self-harm by razor blade [facility])  Swallowed foreign body, initial encounter    ED Discharge Orders    None       Debroah Baller 05/07/19 2126    Gareth Morgan, MD 05/10/19 1155

## 2019-05-08 ENCOUNTER — Encounter (HOSPITAL_COMMUNITY): Payer: Self-pay | Admitting: *Deleted

## 2019-05-08 ENCOUNTER — Inpatient Hospital Stay (HOSPITAL_COMMUNITY)

## 2019-05-08 DIAGNOSIS — T17908S Unspecified foreign body in respiratory tract, part unspecified causing other injury, sequela: Secondary | ICD-10-CM

## 2019-05-08 DIAGNOSIS — X788XXA Intentional self-harm by other sharp object, initial encounter: Secondary | ICD-10-CM | POA: Diagnosis present

## 2019-05-08 DIAGNOSIS — T17900S Unspecified foreign body in respiratory tract, part unspecified causing asphyxiation, sequela: Secondary | ICD-10-CM

## 2019-05-08 LAB — MRSA PCR SCREENING: MRSA by PCR: POSITIVE — AB

## 2019-05-08 LAB — COMPREHENSIVE METABOLIC PANEL
ALT: 10 U/L (ref 0–44)
AST: 12 U/L — ABNORMAL LOW (ref 15–41)
Albumin: 3.8 g/dL (ref 3.5–5.0)
Alkaline Phosphatase: 61 U/L (ref 38–126)
Anion gap: 7 (ref 5–15)
BUN: 7 mg/dL (ref 6–20)
CO2: 25 mmol/L (ref 22–32)
Calcium: 8.7 mg/dL — ABNORMAL LOW (ref 8.9–10.3)
Chloride: 107 mmol/L (ref 98–111)
Creatinine, Ser: 0.7 mg/dL (ref 0.61–1.24)
GFR calc Af Amer: >60 mL/min (ref >60)
GFR calc non Af Amer: >60 mL/min (ref >60)
Glucose, Bld: 91 mg/dL (ref 70–99)
Potassium: 3.7 mmol/L (ref 3.5–5.1)
Sodium: 139 mmol/L (ref 135–145)
Total Bilirubin: 1.1 mg/dL (ref 0.3–1.2)
Total Protein: 6.4 g/dL — ABNORMAL LOW (ref 6.5–8.1)

## 2019-05-08 LAB — CBC
HCT: 45.3 % (ref 39.0–52.0)
Hemoglobin: 14.8 g/dL (ref 13.0–17.0)
MCH: 30 pg (ref 26.0–34.0)
MCHC: 32.7 g/dL (ref 30.0–36.0)
MCV: 91.7 fL (ref 80.0–100.0)
Platelets: 267 10*3/uL (ref 150–400)
RBC: 4.94 MIL/uL (ref 4.22–5.81)
RDW: 12.9 % (ref 11.5–15.5)
WBC: 7.4 10*3/uL (ref 4.0–10.5)
nRBC: 0 % (ref 0.0–0.2)

## 2019-05-08 MED ORDER — POLYETHYLENE GLYCOL 3350 17 G PO PACK
17.0000 g | PACK | Freq: Every day | ORAL | Status: DC
  Administered 2019-05-08: 17 g via ORAL
  Filled 2019-05-08: qty 1

## 2019-05-08 MED ORDER — CHLORHEXIDINE GLUCONATE CLOTH 2 % EX PADS
6.0000 | MEDICATED_PAD | Freq: Every day | CUTANEOUS | Status: AC
  Administered 2019-05-08 – 2019-05-11 (×2): 6 via TOPICAL

## 2019-05-08 MED ORDER — MUPIROCIN 2 % EX OINT
1.0000 "application " | TOPICAL_OINTMENT | Freq: Two times a day (BID) | CUTANEOUS | Status: AC
  Administered 2019-05-08 – 2019-05-12 (×4): 1 via NASAL
  Filled 2019-05-08 (×2): qty 22

## 2019-05-08 NOTE — Progress Notes (Addendum)
After an attempt to clamp the IV, Pt has now pulled his IV out. He says this is only prolonging him wanting to die and just wants to go back to prison. He is not confused, agitated or upset, he is very calmly declining care. Paged the on Call O[place], NP and made aware of patient's refusals. Psych to evaluate in the am.

## 2019-05-08 NOTE — Progress Notes (Signed)
PROGRESS NOTE    Christopher Stephenson  LEX:[id number] DOB: Nov 01, 1985 DOA: 05/07/2019 PCP: Patient, No Pcp Per    Brief Narrative: 33 y.o. male with medical history significant of hepatitis C was brought in by law enforcement for in tensional ingestion of razor blades.  He did this to harm himself he has had multiple suicide attempts previously.  He feels complaints of depression and he just wanted to kill himself.  He has had hospital admissions for similar presentation.  He swallowed 4 razor blades this afternoon that were in the pack.  He has not been eating or drinking much in an attempt to kill himself.  He had a bowel movement today he has some abdominal pain he denies any nausea or vomiting.  He denies urinary complaints chest pain shortness of breath fever chills cough.    ED Course: Rapid Covid test was ordered. ED physician spoke with Dr. Ricky Stabs and Dr. Dulce Sellar. Dr. Dulce Sellar recommended to repeat abdominal x-ray around 7 PM today to confirm where the razor blade is.  He seems to think since he ingested razor blades around 1 PM the blame blade may have already passed the pylorus.Dr. Ricky Stabs recommended laxatives His labs in the ED are unremarkable alcohol level less than 10 salicylate less than 7 CBC and BMP is unremarkable. KUB 3 linear metallic densities in the abdomen 1 of which projects over the stomach and 2 other in the left lower quadrant likely within the bowel.  No evidence of free air. Chest x-ray no acute process   Assessment & Plan:   Active Problems:   Ingestion of foreign body   #1 ingestion of razor blades intentionally to harm himself-admit to MedSurg, start IV fluids, n.p.o. Patient has not had a bowel movement since admission. He took MiraLAX finally last night. KUB-3 metallic foreign bodies 1 within the left mid abdomen and 2 just to the right of the L5 vertebral body. Follow-up KUB this morning. .  #2 depression suicidal intention patient IVC is in the ER.   Will need psych consult once medically cleared.    There is no height or weight on file to calculate BMI.  DVT prophylaxis SCD as patient might need surgery or EGD Code Status: Full code Family Communication: None Disposition Plan: Pending clinical improvement Consults called: GI and general surgery Admission status: Inpatient  Subjective: Patient denies any nausea vomiting abdominal pain or diarrhea he has not had a bowel movement since admission to hospital.  He refused MiraLAX last night and finally ended up taking it.  Objective: Vitals:   05/07/19 1449 05/07/19 1859 05/07/19 2036 05/08/19 0625  BP: (!) 146/101 (!) 132/93 127/79 104/81  Pulse: 72 72 75 69  Resp: 18 12 19 18   Temp: 98 F (36.7 C) 98.3 F (36.8 C) 98.1 F (36.7 C) 97.8 F (36.6 C)  TempSrc: Oral Oral Oral   SpO2: 99% 99% 99% 100%    Intake/Output Summary (Last 24 hours) at 05/08/2019 1047 Last data filed at 05/08/2019 0600 Gross per 24 hour  Intake 1890 ml  Output 400 ml  Net 1490 ml   There were no vitals filed for this visit.  Examination:  General exam: Appears calm and comfortable  Respiratory system: Clear to auscultation. Respiratory effort normal. Cardiovascular system: S1 & S2 heard, RRR. No JVD, murmurs, rubs, gallops or clicks. No pedal edema. Gastrointestinal system: Abdomen is nondistended, soft and nontender. No organomegaly or masses felt. Normal bowel sounds heard. Central nervous system: Alert and oriented. No  focal neurological deficits. Extremities: Symmetric 5 x 5 power. Skin: No rashes, lesions or ulcers Psychiatry: Judgement and insight appear normal. Mood & affect appropriate.     Data Reviewed: I have personally reviewed following labs and imaging studies  CBC: Recent Labs  Lab 05/07/19 1548 05/08/19 0518  WBC 9.4 7.4  HGB 16.2 14.8  HCT 49.7 45.3  MCV 92.2 91.7  PLT 311 267   Basic Metabolic Panel: Recent Labs  Lab 05/07/19 1548 05/08/19 0518  NA 140 139   K 4.5 3.7  CL 103 107  CO2 29 25  GLUCOSE 102* 91  BUN 7 7  CREATININE 0.84 0.70  CALCIUM 8.9 8.7*   GFR: CrCl cannot be calculated (Unknown ideal weight.). Liver Function Tests: Recent Labs  Lab 05/07/19 1548 05/08/19 0518  AST 15 12*  ALT 11 10  ALKPHOS 67 61  BILITOT 1.0 1.1  PROT 7.2 6.4*  ALBUMIN 4.4 3.8   No results for input(s): LIPASE, AMYLASE in the last 168 hours. No results for input(s): AMMONIA in the last 168 hours. Coagulation Profile: No results for input(s): INR, PROTIME in the last 168 hours. Cardiac Enzymes: No results for input(s): CKTOTAL, CKMB, CKMBINDEX, TROPONINI in the last 168 hours. BNP (last 3 results) No results for input(s): PROBNP in the last 8760 hours. HbA1C: No results for input(s): HGBA1C in the last 72 hours. CBG: No results for input(s): GLUCAP in the last 168 hours. Lipid Profile: No results for input(s): CHOL, HDL, LDLCALC, TRIG, CHOLHDL, LDLDIRECT in the last 72 hours. Thyroid Function Tests: No results for input(s): TSH, T4TOTAL, FREET4, T3FREE, THYROIDAB in the last 72 hours. Anemia Panel: No results for input(s): VITAMINB12, FOLATE, FERRITIN, TIBC, IRON, RETICCTPCT in the last 72 hours. Sepsis Labs: No results for input(s): PROCALCITON, LATICACIDVEN in the last 168 hours.  Recent Results (from the past 240 hour(s))  SARS Coronavirus 2 by RT PCR (hospital order, performed in [facility]) Nasopharyngeal Nasopharyngeal Swab     Status: None   Collection Time: 05/07/19  5:44 PM   Specimen: Nasopharyngeal Swab  Result Value Ref Range Status   SARS Coronavirus 2 NEGATIVE NEGATIVE Final    Comment: (NOTE) If result is NEGATIVE SARS-CoV-2 target nucleic acids are NOT DETECTED. The SARS-CoV-2 RNA is generally detectable in upper and lower  respiratory specimens during the acute phase of infection. The lowest  concentration of SARS-CoV-2 viral copies this assay can detect is 250  copies / mL. A negative result does  not preclude SARS-CoV-2 infection  and should not be used as the sole basis for treatment or other  patient management decisions.  A negative result may occur with  improper specimen collection / handling, submission of specimen other  than nasopharyngeal swab, presence of viral mutation(s) within the  areas targeted by this assay, and inadequate number of viral copies  (<250 copies / mL). A negative result must be combined with clinical  observations, patient history, and epidemiological information. If result is POSITIVE SARS-CoV-2 target nucleic acids are DETECTED. The SARS-CoV-2 RNA is generally detectable in upper and lower  respiratory specimens dur ing the acute phase of infection.  Positive  results are indicative of active infection with SARS-CoV-2.  Clinical  correlation with patient history and other diagnostic information is  necessary to determine patient infection status.  Positive results do  not rule out bacterial infection or co-infection with other viruses. If result is PRESUMPTIVE POSTIVE SARS-CoV-2 nucleic acids MAY BE PRESENT.   A presumptive  positive result was obtained on the submitted specimen  and confirmed on repeat testing.  While 2019 novel coronavirus  (SARS-CoV-2) nucleic acids may be present in the submitted sample  additional confirmatory testing may be necessary for epidemiological  and / or clinical management purposes  to differentiate between  SARS-CoV-2 and other Sarbecovirus currently known to infect humans.  If clinically indicated additional testing with an alternate test  methodology [phone]) is advised. The SARS-CoV-2 RNA is generally  detectable in upper and lower respiratory sp ecimens during the acute  phase of infection. The expected result is Negative. Fact Sheet for Patients:  BoilerBrush.com.cy Fact Sheet for Healthcare Providers: https://pope.com/ This test is not yet approved or  cleared by the [place] FDA and has been authorized for detection and/or diagnosis of SARS-CoV-2 by FDA under an Emergency Use Authorization (EUA).  This EUA will remain in effect (meaning this test can be used) for the duration of the COVID-19 declaration under Section 564(b)(1) of the Act, 21 U.S.C. section 360bbb-3(b)(1), unless the authorization is terminated or revoked sooner. Performed at [facility], [address] [place], [place] [address]   MRSA PCR Screening     Status: Abnormal   Collection Time: 05/07/19  8:27 PM   Specimen: Nasal Mucosa; Nasopharyngeal  Result Value Ref Range Status   MRSA by PCR POSITIVE (A) NEGATIVE Final    Comment:        The GeneXpert MRSA Assay (FDA approved for NASAL specimens only), is one component of a comprehensive MRSA colonization surveillance program. It is not intended to diagnose MRSA infection nor to guide or monitor treatment for MRSA infections. RESULT CALLED TO, READ BACK BY AND VERIFIED WITH: P,ALLEN AT 0157 ON 05/08/19 BY A,MOHAMED Performed at [facility], [address], [place], [place] [address]          Radiology Studies: Dg Chest 2 View  Result Date: 05/07/2019 CLINICAL DATA:  Patient swallowed a razor blade of approximately 4 pieces today at approximately 12 or 1 p.m. EXAM: CHEST - 2 VIEW COMPARISON:  None. FINDINGS: The heart size and mediastinal contours are within normal limits. The lungs are clear. No pneumothorax or pleural effusion. No radiopaque foreign body in the chest. The visualized skeletal structures are unremarkable. IMPRESSION: No active cardiopulmonary disease. No radiopaque foreign body in the chest. Electronically Signed   By: Emmaline Kluver M.D.   On: 05/07/2019 15:44   Dg Abd 1 View  Result Date: 05/07/2019 CLINICAL DATA:  Swallowed razor blades, 4 pieces EXAM: ABDOMEN - 1 VIEW COMPARISON:  May 07, 2019 3:29 p.m. FINDINGS: Again noted are 3  metallic foreign bodies within the mid abdomen measuring approximately 1.7 cm each. 1 now projects over the left mid abdomen likely within bowel loops. Two are seen just to the right of the L5 vertebral body also likely within bowel. No fourth foreign body is identified. The colon is air-filled. No acute osseous abnormality. IMPRESSION: Three metallic foreign bodies consistent with the patient's history of ingestion as described above,, 1 within the left mid abdomen and 2 just to the right of the L5 vertebral body. Electronically Signed   By: Jonna Clark M.D.   On: 05/07/2019 19:34   Dg Abdomen 1 View  Result Date: 05/07/2019 CLINICAL DATA:  Patient swallowed a razor blade of approximately 4 pieces today at approximately 12 or 1 p.m. EXAM: ABDOMEN - 1 VIEW COMPARISON:  None. FINDINGS: There are 3 linear metallic densities in the  abdomen, 1 of which projects over the stomach and the other 2 in the left lower quadrant, likely within bowel which most likely represent the ingested contents. There are no dilated loops of bowel. No evidence of free air. No acute finding in the visualized skeleton. IMPRESSION: 3 linear metallic densities in the abdomen, 1 of which projects over the stomach and the other 2 in the left lower quadrant, likely within bowel. These most likely represent the ingested razor blade pieces. No evidence of free air. Electronically Signed   By: Audie Pinto M.D.   On: 05/07/2019 15:47        Scheduled Meds: . Chlorhexidine Gluconate Cloth  6 each Topical Q0600  . mupirocin ointment  1 application Nasal BID   Continuous Infusions: . sodium chloride 150 mL/hr at 05/08/19 0600     LOS: 1 day     Georgette Shell, MD [facility]  If 7PM-7AM, please contact night-coverage [email or web address] Password [facility] 05/08/2019, 10:47 AM
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
## 2019-05-08 NOTE — Consult Note (Signed)
[facility] Consultation Note  Referring Provider: [facility] Primary Care Physician:  Patient, No Pcp Per  Reason for Consultation:  Ingested foreign body  HPI: Christopher Stephenson is a 33 y.o. male who swallowed a few razor blades.  [employer] inmate, history of prior ingested foreign bodies, swallowed 3-4 razor blades yesterday.  By time he got to ED and had COVID test done, all the razor blades on xray were well out of reach of endoscope.  He has had some trouble with urination.  He has had some mild lower abdominal discomfort near his bladder.  No vomiting or bowel movements.  No blood in stool.   History reviewed. No pertinent past medical history.  History reviewed. No pertinent surgical history.  Prior to Admission medications   Not on File    Current Facility-Administered Medications  Medication Dose Route Frequency Provider Last Rate Last Dose  . 0.9 %  sodium chloride infusion   Intravenous Continuous Alwyn Ren, MD 150 mL/hr at 05/08/19 0600    . Chlorhexidine Gluconate Cloth 2 % PADS 6 each  6 each Topical Q0600 Alwyn Ren, MD   6 each at 05/08/19 1209  . ketorolac (TORADOL) 15 MG/ML injection 15 mg  15 mg Intravenous Q6H PRN Jimmye Norman, NP   15 mg at 05/07/19 2041  . mupirocin ointment (BACTROBAN) 2 % 1 application  1 application Nasal BID Alwyn Ren, MD   1 application at 05/08/19 1209  . oxyCODONE (Oxy IR/ROXICODONE) immediate release tablet 5 mg  5 mg Oral Q4H PRN Jimmye Norman, NP   5 mg at 05/08/19 0325    Allergies as of 05/07/2019 - Review Complete 05/07/2019  Allergen Reaction Noted  . Penicillins Other (See Comments) 08/03/2013    No family history on file.  Social History   Socioeconomic History  . Marital status: Single    Spouse name: Not on file  . Number of children: Not on file  . Years of education: Not on file  . Highest education level: Not on file  Occupational History  . Not on  file  Social Needs  . Financial resource strain: Not on file  . Food insecurity    Worry: Not on file    Inability: Not on file  . Transportation needs    Medical: Not on file    Non-medical: Not on file  Tobacco Use  . Smoking status: Not on file  Substance and Sexual Activity  . Alcohol use: Not on file  . Drug use: Not on file  . Sexual activity: Not on file  Lifestyle  . Physical activity    Days per week: Not on file    Minutes per session: Not on file  . Stress: Not on file  Relationships  . Social Musician on phone: Not on file    Gets together: Not on file    Attends religious service: Not on file    Active member of club or organization: Not on file    Attends meetings of clubs or organizations: Not on file    Relationship status: Not on file  . Intimate partner violence    Fear of current or ex partner: Not on file    Emotionally abused: Not on file    Physically abused: Not on file    Forced sexual activity: Not on file  Other Topics Concern  . Not on file  Social History Narrative  . Not on file  Review of Systems: as per HPI, all others negative  Physical Exam: Vital signs in last 24 hours: Temp:  [97.8 F (36.6 C)-98.3 F (36.8 C)] 97.8 F (36.6 C) (11/08 0625) Pulse Rate:  [69-75] 69 (11/08 0625) Resp:  [12-19] 18 (11/08 0625) BP: (104-146)/(79-101) 104/81 (11/08 0625) SpO2:  [99 %-100 %] 100 % (11/08 0625) Last BM Date: 05/07/19 General:   Alert,  Well-developed, well-nourished, pleasant and cooperative in NAD Head:  Normocephalic and atraumatic. Eyes:  Sclera clear, no icterus.   Conjunctiva pink. Ears:  Normal auditory acuity. Nose:  No deformity, discharge,  or lesions. Mouth:  No deformity or lesions.  Oropharynx pink & moist. Neck:  Supple; no masses or thyromegaly. Lungs:  Clear throughout to auscultation.   No wheezes, crackles, or rhonchi. No acute distress. Heart:  Regular rate and rhythm; no murmurs, clicks, rubs,  or  gallops. Abdomen:  Soft,mild lower abdominal suprapubic tenderness, nondistended. No masses, hepatosplenomegaly or hernias noted. Hypoactive bowel sounds without peritonitis Msk:  Symmetrical without gross deformities. Normal posture. Pulses:  Normal pulses noted. Extremities:  Without clubbing or edema. Neurologic:  Alert and  oriented x4;  grossly normal neurologically. Skin:  Intact without significant lesions or rashes. Psych:  Alert and cooperative. Normal mood and affect.   Lab Results: Recent Labs    05/07/19 1548 05/08/19 0518  WBC 9.4 7.4  HGB 16.2 14.8  HCT 49.7 45.3  PLT 311 267   BMET Recent Labs    05/07/19 1548 05/08/19 0518  NA 140 139  K 4.5 3.7  CL 103 107  CO2 29 25  GLUCOSE 102* 91  BUN 7 7  CREATININE 0.84 0.70  CALCIUM 8.9 8.7*   LFT Recent Labs    05/08/19 0518  PROT 6.4*  ALBUMIN 3.8  AST 12*  ALT 10  ALKPHOS 61  BILITOT 1.1   PT/INR No results for input(s): LABPROT, INR in the last 72 hours.  Studies/Results: Dg Chest 2 View  Result Date: 05/07/2019 CLINICAL DATA:  Patient swallowed a razor blade of approximately 4 pieces today at approximately 12 or 1 p.m. EXAM: CHEST - 2 VIEW COMPARISON:  None. FINDINGS: The heart size and mediastinal contours are within normal limits. The lungs are clear. No pneumothorax or pleural effusion. No radiopaque foreign body in the chest. The visualized skeletal structures are unremarkable. IMPRESSION: No active cardiopulmonary disease. No radiopaque foreign body in the chest. Electronically Signed   By: Emmaline KluverNancy  Ballantyne M.D.   On: 05/07/2019 15:44   Dg Abd 1 View  Result Date: 05/07/2019 CLINICAL DATA:  Swallowed razor blades, 4 pieces EXAM: ABDOMEN - 1 VIEW COMPARISON:  May 07, 2019 3:29 p.m. FINDINGS: Again noted are 3 metallic foreign bodies within the mid abdomen measuring approximately 1.7 cm each. 1 now projects over the left mid abdomen likely within bowel loops. Two are seen just to the right of  the L5 vertebral body also likely within bowel. No fourth foreign body is identified. The colon is air-filled. No acute osseous abnormality. IMPRESSION: Three metallic foreign bodies consistent with the patient's history of ingestion as described above,, 1 within the left mid abdomen and 2 just to the right of the L5 vertebral body. Electronically Signed   By: Jonna ClarkBindu  Avutu M.D.   On: 05/07/2019 19:34   Dg Abdomen 1 View  Result Date: 05/07/2019 CLINICAL DATA:  Patient swallowed a razor blade of approximately 4 pieces today at approximately 12 or 1 p.m. EXAM: ABDOMEN - 1 VIEW COMPARISON:  None. FINDINGS: There are 3 linear metallic densities in the abdomen, 1 of which projects over the stomach and the other 2 in the left lower quadrant, likely within bowel which most likely represent the ingested contents. There are no dilated loops of bowel. No evidence of free air. No acute finding in the visualized skeleton. IMPRESSION: 3 linear metallic densities in the abdomen, 1 of which projects over the stomach and the other 2 in the left lower quadrant, likely within bowel. These most likely represent the ingested razor blade pieces. No evidence of free air. Electronically Signed   By: Audie Pinto M.D.   On: 05/07/2019 15:47    Impression:  1.  Foreign body ingestion (razor blade x 3).  At least 2 or 3 razors are still in abdomen from today's xray, look like in ascending colon.  I don't see any evidence of perforation on today's xray, but final read is pending. 2.  Difficulty with urination.  Plan:  1.  Clear liquid diet. 2.  Miralax 17 grams po bid. 3.  Repeat xray abdomen tomorrow morning.  4.  Psychiatry consultation. 5.  [facility] will follow.   LOS: 1 day   Jae Skeet M  05/08/2019, 12:56 PM  Cell [phone] If no answer or after 5 PM call [phone]

## 2019-05-09 ENCOUNTER — Inpatient Hospital Stay (HOSPITAL_COMMUNITY)

## 2019-05-09 DIAGNOSIS — F331 Major depressive disorder, recurrent, moderate: Secondary | ICD-10-CM

## 2019-05-09 DIAGNOSIS — F515 Nightmare disorder: Secondary | ICD-10-CM

## 2019-05-09 DIAGNOSIS — F4312 Post-traumatic stress disorder, chronic: Secondary | ICD-10-CM

## 2019-05-09 DIAGNOSIS — X788XXA Intentional self-harm by other sharp object, initial encounter: Secondary | ICD-10-CM

## 2019-05-09 DIAGNOSIS — T1491XA Suicide attempt, initial encounter: Secondary | ICD-10-CM

## 2019-05-09 MED ORDER — DOCUSATE SODIUM 100 MG PO CAPS
200.0000 mg | ORAL_CAPSULE | Freq: Two times a day (BID) | ORAL | Status: DC
  Administered 2019-05-09 – 2019-05-22 (×25): 200 mg via ORAL
  Filled 2019-05-09 (×27): qty 2

## 2019-05-09 MED ORDER — ONDANSETRON HCL 4 MG/2ML IJ SOLN
4.0000 mg | Freq: Four times a day (QID) | INTRAMUSCULAR | Status: DC | PRN
  Administered 2019-05-20: 4 mg via INTRAVENOUS
  Filled 2019-05-09: qty 2

## 2019-05-09 MED ORDER — PRAZOSIN HCL 1 MG PO CAPS
2.0000 mg | ORAL_CAPSULE | Freq: Every day | ORAL | Status: DC
  Administered 2019-05-09 – 2019-05-17 (×9): 2 mg via ORAL
  Filled 2019-05-09 (×10): qty 2

## 2019-05-09 MED ORDER — POLYETHYLENE GLYCOL 3350 17 G PO PACK
17.0000 g | PACK | Freq: Two times a day (BID) | ORAL | Status: DC
  Administered 2019-05-09 – 2019-05-10 (×3): 17 g via ORAL
  Filled 2019-05-09 (×4): qty 1

## 2019-05-09 MED ORDER — SERTRALINE HCL 20 MG/ML PO CONC
25.0000 mg | Freq: Every day | ORAL | Status: DC
  Administered 2019-05-10 – 2019-05-22 (×13): 25 mg via ORAL
  Filled 2019-05-09 (×16): qty 1.25

## 2019-05-09 NOTE — Progress Notes (Signed)
[facility] Progress Note  Christopher Stephenson 33 y.o. 1985-12-24  CC: Foreign body ingestion   Subjective: Patient denies any abdominal pain or bleeding.  Wants to eat solid food.  X-ray report reviewed.  ROS : Negative for chest pain and shortness of breath   Objective: Vital signs in last 24 hours: Vitals:   05/08/19 2117 05/09/19 0622  BP: 109/76 101/66  Pulse: 83 71  Resp: 18 17  Temp: 98.6 F (37 C) 98 F (36.7 C)  SpO2: 100% 100%    Physical Exam:  General.  Well developed, not in acute distress, in restraints Abdomen.  Soft, nontender, nondistended, bowel sounds present Neuro.  Alert/oriented x3  Lab Results: Recent Labs    05/07/19 1548 05/08/19 0518  NA 140 139  K 4.5 3.7  CL 103 107  CO2 29 25  GLUCOSE 102* 91  BUN 7 7  CREATININE 0.84 0.70  CALCIUM 8.9 8.7*   Recent Labs    05/07/19 1548 05/08/19 0518  AST 15 12*  ALT 11 10  ALKPHOS 67 61  BILITOT 1.0 1.1  PROT 7.2 6.4*  ALBUMIN 4.4 3.8   Recent Labs    05/07/19 1548 05/08/19 0518  WBC 9.4 7.4  HGB 16.2 14.8  HCT 49.7 45.3  MCV 92.2 91.7  PLT 311 267   No results for input(s): LABPROT, INR in the last 72 hours.    Assessment/Plan: -Foreign body ingestion/razor blade x3.  X-ray yesterday showed foreign body in the ascending colon.  Patient denies having any bowel movement today.  Recommendations --------------------------- -Repeat x-ray this morning pending. -Advance diet to full liquid. -Increase MiraLAX to twice daily -GI will follow   Otis Brace MD, [place] 05/09/2019, 9:04 AM  Contact #  [phone]
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
## 2019-05-09 NOTE — Progress Notes (Signed)
PROGRESS NOTE    Christopher Stephenson  QMV:[id number] DOB: [birth year] DOA: 05/07/2019 PCP: Patient, No Pcp Per   Brief Narrative: 33 y.o.malewith medical history significant ofhepatitis C was brought in by law enforcement for in tensional ingestion of razor blades. He did this to harm himself he has had multiple suicide attempts previously. He feels complaints of depression and he just wanted to kill himself. He has had hospital admissions for similar presentation. He swallowed 4 razor blades this afternoon that were in the pack. He has not been eating or drinking much in an attempt to kill himself. He had a bowel movement today he has some abdominal pain he denies any nausea or vomiting. He denies urinary complaints chest pain shortness of breath fever chills cough.    ED Course:Rapid Covid test was ordered. ED physician spoke with Dr. Lynford Humphrey and Dr. Paulita Fujita. Dr. Paulita Fujita recommended to repeat abdominal x-ray around 7 PM today to confirm where the razor blade is. He seems to think since he ingested razor blades around 1 PM the blame blade may have already passed the pylorus.Dr. Lynford Humphrey recommended laxatives His labs in the ED are unremarkable alcohol level less than 10 salicylate less than 7 CBC and BMP is unremarkable. KUB 3 linear metallic densities in the abdomen 1 of which projects over the stomach and 2 other in the left lower quadrant likely within the bowel. No evidence of free air. Chest x-ray no acute process  Assessment & Plan:   Active Problems:   Ingestion of foreign body   Intentional self-harm by razor blade ([place])   Foreign body aspiration, sequela   Major depressive disorder, recurrent episode, moderate ([place])    #1 ingestion of razor blades intentionally to harm himself-KUB from this morning shows the blades have moved to the colon. Patient has not had a bowel movement since admission. He is refusing to take any MiraLAX drink fluids or take IV fluids. We will start  him on a soft diet recheck KUB tomorrow..  #2 depression suicidal intention patient IVC is in the ER-seen by psych does not think he needs inpatient psych admit.  Recommend to start Zoloft 25 mg daily for 3 days and increase to 50 mg daily.  Patient has significant PTSD recommending prazosin 2 mg nightly with potential increase to 5 mg monitor blood pressure while he is on prazosin.  Estimated body mass index is 23.63 kg/m as calculated from the following:   Height as of this encounter: 5\' 9"  (1.753 m).   Weight as of this encounter: 72.6 kg.  DVT prophylaxisSCD  Code Status:Full code Family Communication:None Disposition Plan:Pending clinical improvement/passing of 3 razor blades Consults called:GI and general surgery Admission status:Inpatient    Subjective:  Patient refusing to take MiraLAX is adamantly refusing to drink or have IV fluids he says it hurts to pee he has peed so much with fluids he wants to eat something so he can flush the razor blades out Objective: Vitals:   05/08/19 1333 05/08/19 1900 05/08/19 2117 05/09/19 0622  BP: 117/79  109/76 101/66  Pulse: 79  83 71  Resp: 14  18 17   Temp: 98.8 F (37.1 C)  98.6 F (37 C) 98 F (36.7 C)  TempSrc:      SpO2: 100%  100% 100%  Weight:  72.6 kg    Height:  5\' 9"  (1.753 m)      Intake/Output Summary (Last 24 hours) at 05/09/2019 1315 Last data filed at 05/09/2019 0900 Gross per 24 hour  Intake 3101.86 ml  Output 350 ml  Net 2751.86 ml   Filed Weights   05/08/19 1900  Weight: 72.6 kg    Examination:  General exam: Appears calm and comfortable  Respiratory system: Clear to auscultation. Respiratory effort normal. Cardiovascular system: S1 & S2 heard, RRR. No JVD, murmurs, rubs, gallops or clicks. No pedal edema. Gastrointestinal system: Abdomen is nondistended, soft and nontender. No organomegaly or masses felt. Normal bowel sounds heard. Central nervous system: Alert and oriented. No focal  neurological deficits. Extremities: Symmetric 5 x 5 power. Skin: No rashes, lesions or ulcers Psychiatry: Judgement and insight appear normal. Mood & affect appropriate.     Data Reviewed: I have personally reviewed following labs and imaging studies  CBC: Recent Labs  Lab 05/07/19 1548 05/08/19 0518  WBC 9.4 7.4  HGB 16.2 14.8  HCT 49.7 45.3  MCV 92.2 91.7  PLT 311 267   Basic Metabolic Panel: Recent Labs  Lab 05/07/19 1548 05/08/19 0518  NA 140 139  K 4.5 3.7  CL 103 107  CO2 29 25  GLUCOSE 102* 91  BUN 7 7  CREATININE 0.84 0.70  CALCIUM 8.9 8.7*   GFR: Estimated Creatinine Clearance: 131.3 mL/min (by C-G formula based on SCr of 0.7 mg/dL). Liver Function Tests: Recent Labs  Lab 05/07/19 1548 05/08/19 0518  AST 15 12*  ALT 11 10  ALKPHOS 67 61  BILITOT 1.0 1.1  PROT 7.2 6.4*  ALBUMIN 4.4 3.8   No results for input(s): LIPASE, AMYLASE in the last 168 hours. No results for input(s): AMMONIA in the last 168 hours. Coagulation Profile: No results for input(s): INR, PROTIME in the last 168 hours. Cardiac Enzymes: No results for input(s): CKTOTAL, CKMB, CKMBINDEX, TROPONINI in the last 168 hours. BNP (last 3 results) No results for input(s): PROBNP in the last 8760 hours. HbA1C: No results for input(s): HGBA1C in the last 72 hours. CBG: No results for input(s): GLUCAP in the last 168 hours. Lipid Profile: No results for input(s): CHOL, HDL, LDLCALC, TRIG, CHOLHDL, LDLDIRECT in the last 72 hours. Thyroid Function Tests: No results for input(s): TSH, T4TOTAL, FREET4, T3FREE, THYROIDAB in the last 72 hours. Anemia Panel: No results for input(s): VITAMINB12, FOLATE, FERRITIN, TIBC, IRON, RETICCTPCT in the last 72 hours. Sepsis Labs: No results for input(s): PROCALCITON, LATICACIDVEN in the last 168 hours.  Recent Results (from the past 240 hour(s))  SARS Coronavirus 2 by RT PCR (hospital order, performed in [facility] hospital lab) Nasopharyngeal  Nasopharyngeal Swab     Status: None   Collection Time: 05/07/19  5:44 PM   Specimen: Nasopharyngeal Swab  Result Value Ref Range Status   SARS Coronavirus 2 NEGATIVE NEGATIVE Final    Comment: (NOTE) If result is NEGATIVE SARS-CoV-2 target nucleic acids are NOT DETECTED. The SARS-CoV-2 RNA is generally detectable in upper and lower  respiratory specimens during the acute phase of infection. The lowest  concentration of SARS-CoV-2 viral copies this assay can detect is 250  copies / mL. A negative result does not preclude SARS-CoV-2 infection  and should not be used as the sole basis for treatment or other  patient management decisions.  A negative result may occur with  improper specimen collection / handling, submission of specimen other  than nasopharyngeal swab, presence of viral mutation(s) within the  areas targeted by this assay, and inadequate number of viral copies  (<250 copies / mL). A negative result must be combined with clinical  observations, patient history, and epidemiological  information. If result is POSITIVE SARS-CoV-2 target nucleic acids are DETECTED. The SARS-CoV-2 RNA is generally detectable in upper and lower  respiratory specimens dur ing the acute phase of infection.  Positive  results are indicative of active infection with SARS-CoV-2.  Clinical  correlation with patient history and other diagnostic information is  necessary to determine patient infection status.  Positive results do  not rule out bacterial infection or co-infection with other viruses. If result is PRESUMPTIVE POSTIVE SARS-CoV-2 nucleic acids MAY BE PRESENT.   A presumptive positive result was obtained on the submitted specimen  and confirmed on repeat testing.  While 2019 novel coronavirus  (SARS-CoV-2) nucleic acids may be present in the submitted sample  additional confirmatory testing may be necessary for epidemiological  and / or clinical management purposes  to differentiate between    SARS-CoV-2 and other Sarbecovirus currently known to infect humans.  If clinically indicated additional testing with an alternate test  methodology [phone]) is advised. The SARS-CoV-2 RNA is generally  detectable in upper and lower respiratory sp ecimens during the acute  phase of infection. The expected result is Negative. Fact Sheet for Patients:  BoilerBrush.com.cy Fact Sheet for Healthcare Providers: https://pope.com/ This test is not yet approved or cleared by the [place] FDA and has been authorized for detection and/or diagnosis of SARS-CoV-2 by FDA under an Emergency Use Authorization (EUA).  This EUA will remain in effect (meaning this test can be used) for the duration of the COVID-19 declaration under Section 564(b)(1) of the Act, 21 U.S.C. section 360bbb-3(b)(1), unless the authorization is terminated or revoked sooner. Performed at [facility], [address] [place], [place] [address]   MRSA PCR Screening     Status: Abnormal   Collection Time: 05/07/19  8:27 PM   Specimen: Nasal Mucosa; Nasopharyngeal  Result Value Ref Range Status   MRSA by PCR POSITIVE (A) NEGATIVE Final    Comment:        The GeneXpert MRSA Assay (FDA approved for NASAL specimens only), is one component of a comprehensive MRSA colonization surveillance program. It is not intended to diagnose MRSA infection nor to guide or monitor treatment for MRSA infections. RESULT CALLED TO, READ BACK BY AND VERIFIED WITH: P,ALLEN AT 0157 ON 05/08/19 BY A,MOHAMED Performed at [facility], [address], [place], [place] [address]          Radiology Studies: Dg Chest 2 View  Result Date: 05/07/2019 CLINICAL DATA:  Patient swallowed a razor blade of approximately 4 pieces today at approximately 12 or 1 p.m. EXAM: CHEST - 2 VIEW COMPARISON:  None. FINDINGS: The heart size and mediastinal contours are within  normal limits. The lungs are clear. No pneumothorax or pleural effusion. No radiopaque foreign body in the chest. The visualized skeletal structures are unremarkable. IMPRESSION: No active cardiopulmonary disease. No radiopaque foreign body in the chest. Electronically Signed   By: Emmaline Kluver M.D.   On: 05/07/2019 15:44   Dg Abd 1 View  Result Date: 05/08/2019 CLINICAL DATA:  Patient swallowed razor blade fragments EXAM: ABDOMEN - 1 VIEW COMPARISON:  05/07/2019 FINDINGS: There are 3 metallic foreign bodies in the right mid abdomen which may be within the ascending colon. There is no bowel dilatation to suggest obstruction. There is no evidence of pneumoperitoneum, portal venous gas or pneumatosis. There are no pathologic calcifications along the expected course of the ureters. The osseous structures are unremarkable. IMPRESSION: 3 metallic foreign bodies in the right  mid abdomen which may be within the ascending colon. Electronically Signed   By: Elige Ko   On: 05/08/2019 14:30   Dg Abd 1 View  Result Date: 05/07/2019 CLINICAL DATA:  Swallowed razor blades, 4 pieces EXAM: ABDOMEN - 1 VIEW COMPARISON:  May 07, 2019 3:29 p.m. FINDINGS: Again noted are 3 metallic foreign bodies within the mid abdomen measuring approximately 1.7 cm each. 1 now projects over the left mid abdomen likely within bowel loops. Two are seen just to the right of the L5 vertebral body also likely within bowel. No fourth foreign body is identified. The colon is air-filled. No acute osseous abnormality. IMPRESSION: Three metallic foreign bodies consistent with the patient's history of ingestion as described above,, 1 within the left mid abdomen and 2 just to the right of the L5 vertebral body. Electronically Signed   By: Jonna Clark M.D.   On: 05/07/2019 19:34   Dg Abdomen 1 View  Result Date: 05/07/2019 CLINICAL DATA:  Patient swallowed a razor blade of approximately 4 pieces today at approximately 12 or 1 p.m. EXAM:  ABDOMEN - 1 VIEW COMPARISON:  None. FINDINGS: There are 3 linear metallic densities in the abdomen, 1 of which projects over the stomach and the other 2 in the left lower quadrant, likely within bowel which most likely represent the ingested contents. There are no dilated loops of bowel. No evidence of free air. No acute finding in the visualized skeleton. IMPRESSION: 3 linear metallic densities in the abdomen, 1 of which projects over the stomach and the other 2 in the left lower quadrant, likely within bowel. These most likely represent the ingested razor blade pieces. No evidence of free air. Electronically Signed   By: Emmaline Kluver M.D.   On: 05/07/2019 15:47   Dg Abd 2 Views  Result Date: 05/09/2019 CLINICAL DATA:  Follow-up of ingestion of razor fragments. EXAM: ABDOMEN - 2 VIEW COMPARISON:  Radiographs dated 05/07/2019 and 05/08/2019 FINDINGS: There are 3 metallic foreign bodies in the abdomen. These appear to be in the ascending and transverse portions of the colon,, 1 in the mid ascending colon, 1 near the hepatic flexure and 1 near the splenic flexure. Bowel gas pattern is normal. No free air. Bones are normal. IMPRESSION: 3 metallic foreign bodies in the abdomen as described. The foreign bodies have moved into the colon. Electronically Signed   By: Francene Boyers M.D.   On: 05/09/2019 10:11        Scheduled Meds:  Chlorhexidine Gluconate Cloth  6 each Topical Q0600   mupirocin ointment  1 application Nasal BID   polyethylene glycol  17 g Oral BID   Continuous Infusions:  sodium chloride 150 mL/hr at 05/08/19 2203     LOS: 2 days     Alwyn Ren, MD [facility]  If 7PM-7AM, please contact night-coverage [email or web address] Password [email or web address] 05/09/2019, 1:15 PM

## 2019-05-09 NOTE — Progress Notes (Signed)
Patient stated he does not want his IVF anymore. Saline locked. Denies any pain at IV site. He is drinking fluids and on clear liquid diet.

## 2019-05-09 NOTE — Progress Notes (Signed)
Pt c/o not having BM but then refuses miralax.  States he is not going to eat or take any medication.

## 2019-05-10 ENCOUNTER — Inpatient Hospital Stay (HOSPITAL_COMMUNITY)

## 2019-05-10 DIAGNOSIS — T189XXD Foreign body of alimentary tract, part unspecified, subsequent encounter: Secondary | ICD-10-CM

## 2019-05-10 MED ORDER — POLYETHYLENE GLYCOL 3350 17 G PO PACK
17.0000 g | PACK | Freq: Three times a day (TID) | ORAL | Status: DC
  Administered 2019-05-10 – 2019-05-11 (×3): 17 g via ORAL
  Filled 2019-05-10 (×3): qty 1

## 2019-05-10 NOTE — Progress Notes (Signed)
PROGRESS NOTE    Christopher SartoriusVernon Stephenson  ZOX:[id number] DOB: Oct 17, 1985 DOA: 05/07/2019 PCP: Patient, No Pcp Per    Brief Narrative:  33 y.o.malewith medical history significant ofhepatitis C was brought in by law enforcement for in tensional ingestion of razor blades. He did this to harm himself he has had multiple suicide attempts previously. He feels complaints of depression and he just wanted to kill himself. He has had hospital admissions for similar presentation. He swallowed 4 razor blades this afternoon that were in the pack. He has not been eating or drinking much in an attempt to kill himself. He had a bowel movement today he has some abdominal pain he denies any nausea or vomiting. He denies urinary complaints chest pain shortness of breath fever chills cough.    ED Course:Rapid Covid test was ordered. ED physician spoke with Dr. Ricky StabsJerkins and Dr. Dulce Sellarutlaw. Dr. Dulce Sellarutlaw recommended to repeat abdominal x-ray around 7 PM today to confirm where the razor blade is. He seems to think since he ingested razor blades around 1 PM the blame blade may have already passed the pylorus.Dr. Ricky StabsJerkins recommended laxatives His labs in the ED are unremarkable alcohol level less than 10 salicylate less than 7 CBC and BMP is unremarkable. KUB 3 linear metallic densities in the abdomen 1 of which projects over the stomach and 2 other in the left lower quadrant likely within the bowel. No evidence of free air. Chest x-ray no acute process  Assessment & Plan:   Active Problems:   Ingestion of foreign body   Intentional self-harm by razor blade (HCC)   Foreign body aspiration, sequela   Major depressive disorder, recurrent episode, moderate (HCC)  #1 ingestion of razor blades intentionally to harm himself-KUB 05/10/2019 Three linear metallic densities noted on prior exam are again seen, 1 appears to be in the right colon with the other 2 at the splenic flexure and descending colon. Two new curvilinear  metallic densities are seen in the left upper quadrant; it is uncertain if these represent external objects or ingested foreign bodies MiraLAX increased to 3 times a day.  Continue soft diet.  KUB in the morning.  #2 depression suicidal intention patient IVC is in the ER-seen by psych does not think he needs inpatient psych admit.  Patient refused to take Zoloft. Recommend to start Zoloft 25 mg daily for 3 days and increase to 50 mg daily.  Patient has significant PTSD recommending prazosin 2 mg nightly with potential increase to 5 mg monitor blood pressure while he is on prazosin.     Estimated body mass index is 23.63 kg/m as calculated from the following:   Height as of this encounter: 5\' 9"  (1.753 m).   Weight as of this encounter: 72.6 kg.  DVT prophylaxisSCD  Code Status:Full code Family Communication:None Disposition Plan:Pending clinical improvement/passing of 3 razor blades Consults called:GI and general surgery   Subjective: Complains of generalized abdominal discomfort asking to speak with the social worker not had a bowel movement  Objective: Vitals:   05/09/19 0622 05/09/19 1437 05/09/19 2024 05/10/19 0457  BP: 101/66 136/84 (!) 130/91 114/76  Pulse: 71 77 74 70  Resp: 17 18 20 12   Temp: 98 F (36.7 C) 98.3 F (36.8 C) 98 F (36.7 C) 97.7 F (36.5 C)  TempSrc:   Oral Oral  SpO2: 100% 99% 99% 98%  Weight:      Height:        Intake/Output Summary (Last 24 hours) at 05/10/2019 1131 Last  data filed at 05/09/2019 1748 Gross per 24 hour  Intake 240 ml  Output -  Net 240 ml   Filed Weights   05/08/19 1900  Weight: 72.6 kg    Examination:  General exam: Appears calm and comfortable  Respiratory system: Clear to auscultation. Respiratory effort normal. Cardiovascular system: S1 & S2 heard, RRR. No JVD, murmurs, rubs, gallops or clicks. No pedal edema. Gastrointestinal system: Abdomen is nondistended, soft and nontender. No organomegaly or masses  felt. Normal bowel sounds heard. Central nervous system: Alert and oriented. No focal neurological deficits. Extremities: Symmetric 5 x 5 power. Skin: No rashes, lesions or ulcers Psychiatry: Judgement and insight appear normal. Mood & affect appropriate.     Data Reviewed: I have personally reviewed following labs and imaging studies  CBC: Recent Labs  Lab 05/07/19 1548 05/08/19 0518  WBC 9.4 7.4  HGB 16.2 14.8  HCT 49.7 45.3  MCV 92.2 91.7  PLT 311 267   Basic Metabolic Panel: Recent Labs  Lab 05/07/19 1548 05/08/19 0518  NA 140 139  K 4.5 3.7  CL 103 107  CO2 29 25  GLUCOSE 102* 91  BUN 7 7  CREATININE 0.84 0.70  CALCIUM 8.9 8.7*   GFR: Estimated Creatinine Clearance: 131.3 mL/min (by C-G formula based on SCr of 0.7 mg/dL). Liver Function Tests: Recent Labs  Lab 05/07/19 1548 05/08/19 0518  AST 15 12*  ALT 11 10  ALKPHOS 67 61  BILITOT 1.0 1.1  PROT 7.2 6.4*  ALBUMIN 4.4 3.8   No results for input(s): LIPASE, AMYLASE in the last 168 hours. No results for input(s): AMMONIA in the last 168 hours. Coagulation Profile: No results for input(s): INR, PROTIME in the last 168 hours. Cardiac Enzymes: No results for input(s): CKTOTAL, CKMB, CKMBINDEX, TROPONINI in the last 168 hours. BNP (last 3 results) No results for input(s): PROBNP in the last 8760 hours. HbA1C: No results for input(s): HGBA1C in the last 72 hours. CBG: No results for input(s): GLUCAP in the last 168 hours. Lipid Profile: No results for input(s): CHOL, HDL, LDLCALC, TRIG, CHOLHDL, LDLDIRECT in the last 72 hours. Thyroid Function Tests: No results for input(s): TSH, T4TOTAL, FREET4, T3FREE, THYROIDAB in the last 72 hours. Anemia Panel: No results for input(s): VITAMINB12, FOLATE, FERRITIN, TIBC, IRON, RETICCTPCT in the last 72 hours. Sepsis Labs: No results for input(s): PROCALCITON, LATICACIDVEN in the last 168 hours.  Recent Results (from the past 240 hour(s))  SARS Coronavirus 2  by RT PCR (hospital order, performed in [facility] hospital lab) Nasopharyngeal Nasopharyngeal Swab     Status: None   Collection Time: 05/07/19  5:44 PM   Specimen: Nasopharyngeal Swab  Result Value Ref Range Status   SARS Coronavirus 2 NEGATIVE NEGATIVE Final    Comment: (NOTE) If result is NEGATIVE SARS-CoV-2 target nucleic acids are NOT DETECTED. The SARS-CoV-2 RNA is generally detectable in upper and lower  respiratory specimens during the acute phase of infection. The lowest  concentration of SARS-CoV-2 viral copies this assay can detect is 250  copies / mL. A negative result does not preclude SARS-CoV-2 infection  and should not be used as the sole basis for treatment or other  patient management decisions.  A negative result may occur with  improper specimen collection / handling, submission of specimen other  than nasopharyngeal swab, presence of viral mutation(s) within the  areas targeted by this assay, and inadequate number of viral copies  (<250 copies / mL). A negative result must be  combined with clinical  observations, patient history, and epidemiological information. If result is POSITIVE SARS-CoV-2 target nucleic acids are DETECTED. The SARS-CoV-2 RNA is generally detectable in upper and lower  respiratory specimens dur ing the acute phase of infection.  Positive  results are indicative of active infection with SARS-CoV-2.  Clinical  correlation with patient history and other diagnostic information is  necessary to determine patient infection status.  Positive results do  not rule out bacterial infection or co-infection with other viruses. If result is PRESUMPTIVE POSTIVE SARS-CoV-2 nucleic acids MAY BE PRESENT.   A presumptive positive result was obtained on the submitted specimen  and confirmed on repeat testing.  While 2019 novel coronavirus  (SARS-CoV-2) nucleic acids may be present in the submitted sample  additional confirmatory testing may be necessary for  epidemiological  and / or clinical management purposes  to differentiate between  SARS-CoV-2 and other Sarbecovirus currently known to infect humans.  If clinically indicated additional testing with an alternate test  methodology [phone]) is advised. The SARS-CoV-2 RNA is generally  detectable in upper and lower respiratory sp ecimens during the acute  phase of infection. The expected result is Negative. Fact Sheet for Patients:  StrictlyIdeas.no Fact Sheet for Healthcare Providers: BankingDealers.co.za This test is not yet approved or cleared by the [place] FDA and has been authorized for detection and/or diagnosis of SARS-CoV-2 by FDA under an Emergency Use Authorization (EUA).  This EUA will remain in effect (meaning this test can be used) for the duration of the COVID-19 declaration under Section 564(b)(1) of the Act, 21 U.S.C. section 360bbb-3(b)(1), unless the authorization is terminated or revoked sooner. Performed at [facility], [place] [address] [place], [place] [address]   MRSA PCR Screening     Status: Abnormal   Collection Time: 05/07/19  8:27 PM   Specimen: Nasal Mucosa; Nasopharyngeal  Result Value Ref Range Status   MRSA by PCR POSITIVE (A) NEGATIVE Final    Comment:        The GeneXpert MRSA Assay (FDA approved for NASAL specimens only), is one component of a comprehensive MRSA colonization surveillance program. It is not intended to diagnose MRSA infection nor to guide or monitor treatment for MRSA infections. RESULT CALLED TO, READ BACK BY AND VERIFIED WITH: P,ALLEN AT 0157 ON 05/08/19 BY A,MOHAMED Performed at [facility] [address] [place], [place] [address]          Radiology Studies: Dg Abd 2 Views  Result Date: 05/10/2019 CLINICAL DATA:  Foreign body ingestion. EXAM: ABDOMEN - 2 VIEW COMPARISON:  May 09, 2019. FINDINGS: The bowel gas pattern is  normal. There is no evidence of free air. Three linear metallic densities are again noted, 1 on the right side into in the left upper quadrant. These most likely represent ingested foreign bodies within the colon. Also noted are 2 other metallic curvilinear densities overlying the left upper quadrant which were not present on prior exam; is uncertain if these represent ingested foreign bodies or overlying artifact. IMPRESSION: Three linear metallic densities noted on prior exam are again seen, 1 appears to be in the right colon with the other 2 at the splenic flexure and descending colon. Two new curvilinear metallic densities are seen in the left upper quadrant; it is uncertain if these represent external objects or ingested foreign bodies. Electronically Signed   By: Marijo Conception M.D.   On: 05/10/2019 09:57   Dg Abd 2 Views  Result  Date: 05/09/2019 CLINICAL DATA:  Follow-up of ingestion of razor fragments. EXAM: ABDOMEN - 2 VIEW COMPARISON:  Radiographs dated 05/07/2019 and 05/08/2019 FINDINGS: There are 3 metallic foreign bodies in the abdomen. These appear to be in the ascending and transverse portions of the colon,, 1 in the mid ascending colon, 1 near the hepatic flexure and 1 near the splenic flexure. Bowel gas pattern is normal. No free air. Bones are normal. IMPRESSION: 3 metallic foreign bodies in the abdomen as described. The foreign bodies have moved into the colon. Electronically Signed   By: Francene Boyers M.D.   On: 05/09/2019 10:11        Scheduled Meds: . Chlorhexidine Gluconate Cloth  6 each Topical Q0600  . docusate sodium  200 mg Oral BID  . mupirocin ointment  1 application Nasal BID  . polyethylene glycol  17 g Oral Q8H  . prazosin  2 mg Oral QHS  . sertraline  25 mg Oral Daily   Continuous Infusions: . sodium chloride 150 mL/hr at 05/08/19 2203     LOS: 3 days     Alwyn Ren, MD [facility]  If 7PM-7AM, please contact night-coverage  [email or web address] Password [email or web address] 05/10/2019, 11:31 AM

## 2019-05-10 NOTE — Progress Notes (Signed)
[facility] Gastroenterology Progress Note  Christopher Stephenson 33 y.o. [birth year]  CC: Foreign body ingestion   Subjective: -Diet was advanced to soft yesterday.  Not having bowel movement despite of taking MiraLAX twice a day.  Complaining of generalized abdominal discomfort.  ROS : Negative for chest pain and shortness of breath   Objective: Vital signs in last 24 hours: Vitals:   05/09/19 2024 05/10/19 0457  BP: (!) 130/91 114/76  Pulse: 74 70  Resp: 20 12  Temp: 98 F (36.7 C) 97.7 F (36.5 C)  SpO2: 99% 98%    Physical Exam:  General.  Well developed, not in acute distress, in restraints Abdomen.  Soft, nontender, nondistended, bowel sounds present Neuro.  Alert/oriented x3  Lab Results: Recent Labs    05/07/19 1548 05/08/19 0518  NA 140 139  K 4.5 3.7  CL 103 107  CO2 29 25  GLUCOSE 102* 91  BUN 7 7  CREATININE 0.84 0.70  CALCIUM 8.9 8.7*   Recent Labs    05/07/19 1548 05/08/19 0518  AST 15 12*  ALT 11 10  ALKPHOS 67 61  BILITOT 1.0 1.1  PROT 7.2 6.4*  ALBUMIN 4.4 3.8   Recent Labs    05/07/19 1548 05/08/19 0518  WBC 9.4 7.4  HGB 16.2 14.8  HCT 49.7 45.3  MCV 92.2 91.7  PLT 311 267   No results for input(s): LABPROT, INR in the last 72 hours.    Assessment/Plan: -Foreign body ingestion/razor blade x3.  X-ray yesterday showed foreign body in the ascending colon.  Patient denies having any bowel movement today. -Constipation  Recommendations --------------------------- -Increase MiraLAX to 3 times a day -Repeat x-ray pending this morning -GI will follow   Otis Brace MD, FACP 05/10/2019, 9:15 AM  Contact #  [phone]

## 2019-05-10 NOTE — TOC Initial Note (Signed)
Transition of Care [facility]) - Initial/Assessment Note    Patient Details  Name: Christopher Stephenson MRN: [id number] Date of Birth: 12-10-1985  [facility] CM/SW Contact:    Trish Mage, LCSW Phone Number: 05/10/2019, 3:24 PM  Clinical Narrative:   Met with patient per MD consult order.  Mr Christopher Stephenson is fragile in terms of mental health, and is currently being held in the [facility] jail for a hearing, where he states he is getting far fewer services than when he was in prison.  He is asking for me to advocate for him getting a sentencing hearing scheduled, because at that point he would be transferred to a different facility.  Per [employer] in room with patient, I needed to go through their supervisor, Mr Christopher Stephenson, [phone].  Mr Christopher Stephenson confirmed that it was appropriate for me to advocate to Mr Christopher Stephenson's attorney for him-Christopher Stephenson., [phone].  Spoke to Mr Christopher Stephenson directly, asking for help with getting hearing scheduled.  Took Mr Smith's number to Mr Christopher Stephenson for his use when approved by those watching him.  TOC will continue to follow during the course of hospitalization.                 Expected Discharge Plan: Corrections Facility Barriers to Discharge: No Barriers Identified   Patient Goals and CMS Choice        Expected Discharge Plan and Services Expected Discharge Plan: Corrections Facility                                              Prior Living Arrangements/Services                       Activities of Daily Living Home Assistive Devices/Equipment: None ADL Screening (condition at time of admission) Patient's cognitive ability adequate to safely complete daily activities?: Yes Is the patient deaf or have difficulty hearing?: No Does the patient have difficulty seeing, even when wearing glasses/contacts?: No Does the patient have difficulty concentrating, remembering, or making decisions?: No Patient able to express need for  assistance with ADLs?: Yes Does the patient have difficulty dressing or bathing?: No Independently performs ADLs?: Yes (appropriate for developmental age) Does the patient have difficulty walking or climbing stairs?: No Weakness of Legs: None Weakness of Arms/Hands: None  Permission Sought/Granted                  Emotional Assessment              Admission diagnosis:  Ingestion of foreign body [T18.9XXA] Patient Active Problem List   Diagnosis Date Noted  . Major depressive disorder, recurrent episode, moderate ([place]) 05/09/2019  . Intentional self-harm by razor blade ([place])   . Foreign body aspiration, sequela   . Ingestion of foreign body 05/07/2019   PCP:  Patient, No Pcp Per Pharmacy:  No Pharmacies Listed    Social Determinants of Health (SDOH) Interventions    Readmission Risk Interventions No flowsheet data found.

## 2019-05-10 NOTE — Progress Notes (Addendum)
Chaplain provided support at pt request / RN referral.    Christopher Stephenson is concerned about returning to [facility]. jail where he reports he has not been receiving mental health support.  Relates he has been on 23 hour lockdown - often without clothing while on suicide watch.  Expresses SI around returning to jail - relating that if he continues in context where he is unsupported, he is fearful that he will be overwhelmed and continue to experience SI / SA.  HE is understands that he will discharge and return when he passes foreign bodies in GI.      Christopher Stephenson reports he had not been receiving medication in jail setting and he is concerned that the medication he is receiving at [facility] is "functioning" prior to discharge back to this setting.  He is fearful that he will not have the opportunity to consult around medication once returning to jail.   Christopher Stephenson had received psychiatric and therapeutic support during his stay at [facility] prior to parole.  Notes he received group support as well as individual counseling, though is not specific about interventions.  States it is helpful for him to "talk to counselor or group when feeling overwhlemed."  He was hopeful to receive support at [facility] when paroled, but was unable to establish support there.    Christopher Stephenson is hopeful to have a mental health evaluation by someone within the court system prior to his court hearing.  He recognizes that his court date is not until next month - due to judge's availability - so he is facing returning to jail for an extended period.  Feels an evaluation by court will allow him to be held in a setting where he has support for mental health needs.   Chaplain consulted with SW, who is consulting with jail / pt's attorney   Support:  Christopher Stephenson is supported by his mother, whom he is able to contact via phone for 15 minutes when in jail.  He lived with mother prior to his return to [place]  He has 2 daughters (18,12).  Notes that both have step-father  figures and relationship with them is not a primary motivator for him, as he knows they are supported and doesn't want to "mess up" their lives.     Chaplain provided empathic presence, reflective listening, recognition of Christopher Stephenson's advocacy for himself, worked with pt around identifying supports / emotional and spiritual interventions when feeling overwhelmed.

## 2019-05-11 ENCOUNTER — Inpatient Hospital Stay (HOSPITAL_COMMUNITY)

## 2019-05-11 MED ORDER — TRAMADOL HCL 50 MG PO TABS
50.0000 mg | ORAL_TABLET | Freq: Once | ORAL | Status: AC
  Administered 2019-05-11: 50 mg via ORAL
  Filled 2019-05-11: qty 1

## 2019-05-11 MED ORDER — PEG 3350-KCL-NA BICARB-NACL 420 G PO SOLR: 4000.0000 mL | Freq: Once | ORAL | Status: DC

## 2019-05-11 MED ORDER — OXYCODONE HCL 5 MG PO TABS
5.0000 mg | ORAL_TABLET | Freq: Once | ORAL | Status: AC
  Administered 2019-05-11: 5 mg via ORAL
  Filled 2019-05-11: qty 1

## 2019-05-11 NOTE — Progress Notes (Signed)
Patient is refusing medications and refused his physical assessment. The two [place] [employer] in the room was made aware. Will continue to monitor.

## 2019-05-11 NOTE — Progress Notes (Signed)
[facility] Gastroenterology Progress Note  Christopher Stephenson 33 y.o. 04/08/1986  CC: Foreign body ingestion   Subjective: -Patient seen and examined at bedside.  Repeat x-ray finding showing multiple foreign body discussed with the patient.  He is denying swallowing any more foreign body since admission.  He is upset and cursing with F** words.  Last bowel movement yesterday.  ROS : Negative for chest pain and shortness of breath   Objective: Vital signs in last 24 hours: Vitals:   05/10/19 2107 05/11/19 0636  BP: 124/71 119/78  Pulse: 78 72  Resp: 18 16  Temp: 98.4 F (36.9 C) 98.4 F (36.9 C)  SpO2: 98% 98%    Physical Exam:  General.  Well developed, not in acute distress, in restraints Abdomen.  Soft, generalized discomfort on palpation, bowel sounds present.  Guarding noted but no rebound tenderness. Neuro.  Alert/oriented x3 Psych.  He is anxious.  Lab Results: No results for input(s): NA, K, CL, CO2, GLUCOSE, BUN, CREATININE, CALCIUM, MG, PHOS in the last 72 hours. No results for input(s): AST, ALT, ALKPHOS, BILITOT, PROT, ALBUMIN in the last 72 hours. No results for input(s): WBC, NEUTROABS, HGB, HCT, MCV, PLT in the last 72 hours. No results for input(s): LABPROT, INR in the last 72 hours.    Assessment/Plan: -Multiple foreign body ingestion/razor blade   X-ray now showing more foreign bodies than initial admission.  -Constipation.  Improving.  Last bowel movement yesterday  Recommendations --------------------------- -Start colon prep.  Patient was advised to drink it slowly as tolerated. -Continue current diet. -Repeat x-ray in the morning -GI will follow   Otis Brace MD, FACP 05/11/2019, 9:35 AM  Contact #  [phone]

## 2019-05-11 NOTE — Progress Notes (Signed)
PROGRESS NOTE    Christopher Stephenson  GYB:[id number] DOB: [birth year] DOA: 05/07/2019 PCP: Patient, No Pcp Per   Brief Narrative: 33 y.o.malewith medical history significant ofhepatitis C was brought in by law enforcement for in tensional ingestion of razor blades. He did this to harm himself he has had multiple suicide attempts previously. He feels complaints of depression and he just wanted to kill himself. He has had hospital admissions for similar presentation. He swallowed 4 razor blades this afternoon that were in the pack. He has not been eating or drinking much in an attempt to kill himself. He had a bowel movement today he has some abdominal pain he denies any nausea or vomiting. He denies urinary complaints chest pain shortness of breath fever chills cough. ED Course:Rapid Covid test was ordered. ED physician spoke with Dr. Lynford Humphrey and Dr. Paulita Fujita. Dr. Paulita Fujita recommended to repeat abdominal x-ray around 7 PM today to confirm where the razor blade is. He seems to think since he ingested razor blades around 1 PM the blame blade may have already passed the pylorus.Dr. Lynford Humphrey recommended laxatives His labs in the ED are unremarkable alcohol level less than 10 salicylate less than 7 CBC and BMP is unremarkable. KUB 3 linear metallic densities in the abdomen 1 of which projects over the stomach and 2 other in the left lower quadrant likely within the bowel. No evidence of free air. Chest x-ray no acute process  Assessment & Plan:   Active Problems:   Ingestion of foreign body   Intentional self-harm by razor blade ([place])   Foreign body aspiration, sequela   Major depressive disorder, recurrent episode, moderate ([place])   #1 ingestion of razor blades intentionally to harm himself-he was admitted after ingesting 3 razor blades.  At the time of admission KUB showed 3 razor blades which I do not see it in the KUB from today.  He probably passed those blades.  However there is multiple  foreign bodies seen now which was not there at the time of admission.  I am not sure if it does look like a straw??  He denies taking any foreign bodies in.  He says he only eats what he gets.  He is going to be getting colon prep today which I hope you will take.  He has been refusing care and assessment.  Psych was consulted 05/09/2019 appreciate the recommendations.  However the marshalls are requesting a psych evaluation again as he continues to refuse treatment and care.  Follow-up KUB in a.m.  Continue soft diet.  #2 depression suicidal intention patient IVC is in the ER-seen by psych does not think he needs inpatient psych admit.Recommend to start Zoloft 25 mg daily for 3 days and increase to 50 mg daily. Patient has significant PTSD recommending prazosin 2 mg nightly with potential increase to 5 mg monitor blood pressure while he is on prazosin.    Estimated body mass index is 23.63 kg/m as calculated from the following:   Height as of this encounter: 5\' 9"  (1.753 m).   Weight as of this encounter: 72.6 kg.    Subjective: Resting in bed reports he had a severe pain last night in his lower abdomen none at this time no nausea vomiting he had a bowel movement yesterday he does not know of the plates were passed in the BM the BM was loose.  Objective: Vitals:   05/10/19 0457 05/10/19 1336 05/10/19 2107 05/11/19 0636  BP: 114/76 117/77 124/71 119/78  Pulse: 70 92  78 72  Resp: 12 18 18 16   Temp: 97.7 F (36.5 C) 98.1 F (36.7 C) 98.4 F (36.9 C) 98.4 F (36.9 C)  TempSrc: Oral Oral Oral Oral  SpO2: 98% 97% 98% 98%  Weight:      Height:        Intake/Output Summary (Last 24 hours) at 05/11/2019 1137 Last data filed at 05/11/2019 [id number] Gross per 24 hour  Intake 1300 ml  Output -  Net 1300 ml   Filed Weights   05/08/19 1900  Weight: 72.6 kg    Examination:  General exam: Appears calm and comfortable  Respiratory system: Clear to auscultation. Respiratory effort normal.  Cardiovascular system: S1 & S2 heard, RRR. No JVD, murmurs, rubs, gallops or clicks. No pedal edema. Gastrointestinal system: Abdomen is nondistended, soft and mild generalized tender. No organomegaly or masses felt. Normal bowel sounds heard. Central nervous system: Alert and oriented. No focal neurological deficits. Extremities: Symmetric 5 x 5 power. Skin: No rashes, lesions or ulcers Psychiatry: Judgement and insight appear normal. Mood & affect appropriate.     Data Reviewed: I have personally reviewed following labs and imaging studies  CBC: Recent Labs  Lab 05/07/19 1548 05/08/19 0518  WBC 9.4 7.4  HGB 16.2 14.8  HCT 49.7 45.3  MCV 92.2 91.7  PLT 311 267   Basic Metabolic Panel: Recent Labs  Lab 05/07/19 1548 05/08/19 0518  NA 140 139  K 4.5 3.7  CL 103 107  CO2 29 25  GLUCOSE 102* 91  BUN 7 7  CREATININE 0.84 0.70  CALCIUM 8.9 8.7*   GFR: Estimated Creatinine Clearance: 131.3 mL/min (by C-G formula based on SCr of 0.7 mg/dL). Liver Function Tests: Recent Labs  Lab 05/07/19 1548 05/08/19 0518  AST 15 12*  ALT 11 10  ALKPHOS 67 61  BILITOT 1.0 1.1  PROT 7.2 6.4*  ALBUMIN 4.4 3.8   No results for input(s): LIPASE, AMYLASE in the last 168 hours. No results for input(s): AMMONIA in the last 168 hours. Coagulation Profile: No results for input(s): INR, PROTIME in the last 168 hours. Cardiac Enzymes: No results for input(s): CKTOTAL, CKMB, CKMBINDEX, TROPONINI in the last 168 hours. BNP (last 3 results) No results for input(s): PROBNP in the last 8760 hours. HbA1C: No results for input(s): HGBA1C in the last 72 hours. CBG: No results for input(s): GLUCAP in the last 168 hours. Lipid Profile: No results for input(s): CHOL, HDL, LDLCALC, TRIG, CHOLHDL, LDLDIRECT in the last 72 hours. Thyroid Function Tests: No results for input(s): TSH, T4TOTAL, FREET4, T3FREE, THYROIDAB in the last 72 hours. Anemia Panel: No results for input(s): VITAMINB12, FOLATE,  FERRITIN, TIBC, IRON, RETICCTPCT in the last 72 hours. Sepsis Labs: No results for input(s): PROCALCITON, LATICACIDVEN in the last 168 hours.  Recent Results (from the past 240 hour(s))  SARS Coronavirus 2 by RT PCR (hospital order, [facility]) Nasopharyngeal Nasopharyngeal Swab     Status: None   Collection Time: 05/07/19  5:44 PM   Specimen: Nasopharyngeal Swab  Result Value Ref Range Status   SARS Coronavirus 2 NEGATIVE NEGATIVE Final    Comment: (NOTE) If result is NEGATIVE SARS-CoV-2 target nucleic acids are NOT DETECTED. The SARS-CoV-2 RNA is generally detectable in upper and lower  respiratory specimens during the acute phase of infection. The lowest  concentration of SARS-CoV-2 viral copies this assay can detect is 250  copies / mL. A negative result does not preclude SARS-CoV-2 infection  and should  not be used as the sole basis for treatment or other  patient management decisions.  A negative result may occur with  improper specimen collection / handling, submission of specimen other  than nasopharyngeal swab, presence of viral mutation(s) within the  areas targeted by this assay, and inadequate number of viral copies  (<250 copies / mL). A negative result must be combined with clinical  observations, patient history, and epidemiological information. If result is POSITIVE SARS-CoV-2 target nucleic acids are DETECTED. The SARS-CoV-2 RNA is generally detectable in upper and lower  respiratory specimens dur ing the acute phase of infection.  Positive  results are indicative of active infection with SARS-CoV-2.  Clinical  correlation with patient history and other diagnostic information is  necessary to determine patient infection status.  Positive results do  not rule out bacterial infection or co-infection with other viruses. If result is PRESUMPTIVE POSTIVE SARS-CoV-2 nucleic acids MAY BE PRESENT.   A presumptive positive result was obtained on  the submitted specimen  and confirmed on repeat testing.  While 2019 novel coronavirus  (SARS-CoV-2) nucleic acids may be present in the submitted sample  additional confirmatory testing may be necessary for epidemiological  and / or clinical management purposes  to differentiate between  SARS-CoV-2 and other Sarbecovirus currently known to infect humans.  If clinically indicated additional testing with an alternate test  methodology [phone]) is advised. The SARS-CoV-2 RNA is generally  detectable in upper and lower respiratory sp ecimens during the acute  phase of infection. The expected result is Negative. Fact Sheet for Patients:  BoilerBrush.com.cy Fact Sheet for Healthcare Providers: https://pope.com/ This test is not yet approved or cleared by the [place] FDA and has been authorized for detection and/or diagnosis of SARS-CoV-2 by FDA under an Emergency Use Authorization (EUA).  This EUA will remain in effect (meaning this test can be used) for the duration of the COVID-19 declaration under Section 564(b)(1) of the Act, 21 U.S.C. section 360bbb-3(b)(1), unless the authorization is terminated or revoked sooner. Performed at [facility], [address], [place], [place] [address]   MRSA PCR Screening     Status: Abnormal   Collection Time: 05/07/19  8:27 PM   Specimen: Nasal Mucosa; Nasopharyngeal  Result Value Ref Range Status   MRSA by PCR POSITIVE (A) NEGATIVE Final    Comment:        The GeneXpert MRSA Assay (FDA approved for NASAL specimens only), is one component of a comprehensive MRSA colonization surveillance program. It is not intended to diagnose MRSA infection nor to guide or monitor treatment for MRSA infections. RESULT CALLED TO, READ BACK BY AND VERIFIED WITH: P,ALLEN AT 0157 ON 05/08/19 BY A,MOHAMED Performed at [facility], [address], [place], [place]  [address]          Radiology Studies: Dg Abd 1 View  Result Date: 05/11/2019 CLINICAL DATA:  Ingestion of radiopaque foreign body. EXAM: ABDOMEN - 1 VIEW COMPARISON:  05/10/2019; 05/09/2019 FINDINGS: Interval increase in number of linear radiopaque foreign bodies, previously 5 radiopaque foreign bodies on the 05/10/2019, 3 were identified on the 05/09/2019 examination, while currently there are 9 radiopaque foreign bodies are identified the majority of which appear to be located within the cecum and ascending colon. Dominant radiopaque foreign body measures approximately 2.4 x 0.2 cm. Nonobstructive bowel gas pattern. Nondiagnostic evaluation for pneumoperitoneum secondary to supine positioning and exclusion of the lower thorax. No pneumatosis or portal venous gas. No acute osseous  abnormalities. Note is made of a small right-sided os acetabuli. IMPRESSION: Interval increase in number of radiopaque foreign bodies, all of which overlie the expected location the cecum and ascending colon. Findings suggestive of continued foreign body ingestion. Electronically Signed   By: Simonne Come M.D.   On: 05/11/2019 07:58   Dg Abd 2 Views  Result Date: 05/10/2019 CLINICAL DATA:  Foreign body ingestion. EXAM: ABDOMEN - 2 VIEW COMPARISON:  May 09, 2019. FINDINGS: The bowel gas pattern is normal. There is no evidence of free air. Three linear metallic densities are again noted, 1 on the right side into in the left upper quadrant. These most likely represent ingested foreign bodies within the colon. Also noted are 2 other metallic curvilinear densities overlying the left upper quadrant which were not present on prior exam; is uncertain if these represent ingested foreign bodies or overlying artifact. IMPRESSION: Three linear metallic densities noted on prior exam are again seen, 1 appears to be in the right colon with the other 2 at the splenic flexure and descending colon. Two new curvilinear metallic densities are  seen in the left upper quadrant; it is uncertain if these represent external objects or ingested foreign bodies. Electronically Signed   By: Lupita Raider M.D.   On: 05/10/2019 09:57        Scheduled Meds: . Chlorhexidine Gluconate Cloth  6 each Topical Q0600  . docusate sodium  200 mg Oral BID  . mupirocin ointment  1 application Nasal BID  . polyethylene glycol-electrolytes  4,000 mL Oral Once  . prazosin  2 mg Oral QHS  . sertraline  25 mg Oral Daily   Continuous Infusions: . sodium chloride 150 mL/hr at 05/08/19 2203     LOS: 4 days     Alwyn Ren, MD [facility]  If 7PM-7AM, please contact night-coverage [email or web address] Password [facility] 05/11/2019, 11:37 AM

## 2019-05-11 NOTE — Progress Notes (Signed)
Patient is using iPad to consult with a NP at [facility].

## 2019-05-11 NOTE — Progress Notes (Signed)
Pt states he dose not want to be compliant with medication regimen here because he states that he will not continue to get the treatment he needs once he is back in his holding cell. [place] [employer] aware and with discussion patient has agreed to follow the treatment plan. Pt will need continued education. Pt however is only agreeing to drink 2 cups of golytely. RN request pharmacy to send pt zoloft to administer since it was refused earlier in day, see MAR.

## 2019-05-11 NOTE — Consult Note (Signed)
  Patient seen and evaluated in person by this provider.  He reports swallowing 3 razor blades to "go to sleep and not wake up."  Reports 50 other sPuicide attempts with no success.  He was recently assessed by our provider yesterday with similar complaints. New consult ordered due to patient refusing his medications. At this time patient has capacity to refuse medications. He understands he would benefit from taking his medications, however he is adamant that he does not receive the services he needs while in jail. He is observed with pressured speech, tangential and ruminating about his mental health services. He is willing to take the medication however is seeking inpatient admission for ongoing.   He continues to endorse ongoing suicidal ideations with no plan or intent. Will recommend inpatient admission, however he is currently in the custody of [employer]. Discussed with him state hospitals that have forensic and prison facilities, he is open to this as well. Patient does report over 50 suicide attempts with moderate to high risk attempts. He will need long term therapy and may benefit from DBT although he does not have a diagnosis of BPD.

## 2019-05-11 NOTE — Progress Notes (Signed)
Chaplain follow up for continued support.    Pt was asleep during this chaplain visit.  Sitter and two marshalls at bedside.  Chaplain introduced self, sitter will report chaplain's visit when Ayden wakes.  Will return for continued support.

## 2019-05-12 ENCOUNTER — Inpatient Hospital Stay (HOSPITAL_COMMUNITY)

## 2019-05-12 MED ORDER — HYDROMORPHONE HCL 1 MG/ML IJ SOLN
1.0000 mg | Freq: Once | INTRAMUSCULAR | Status: AC
  Administered 2019-05-12: 1 mg via INTRAVENOUS
  Filled 2019-05-12: qty 1

## 2019-05-12 MED ORDER — HYDROMORPHONE HCL 1 MG/ML IJ SOLN
1.0000 mg | INTRAMUSCULAR | Status: DC | PRN
  Administered 2019-05-12 – 2019-05-22 (×50): 1 mg via INTRAVENOUS
  Filled 2019-05-12 (×52): qty 1

## 2019-05-12 MED ORDER — LINACLOTIDE 145 MCG PO CAPS
145.0000 ug | ORAL_CAPSULE | Freq: Every day | ORAL | Status: DC
  Administered 2019-05-12 – 2019-05-20 (×9): 145 ug via ORAL
  Filled 2019-05-12 (×9): qty 1

## 2019-05-12 NOTE — Progress Notes (Signed)
PROGRESS NOTE    Christopher Stephenson  ZOX:[id number] DOB: 09/25/1985 DOA: 05/07/2019 PCP: Patient, No Pcp Per   Brief Narrative:  33 y.o.malewith medical history significant ofhepatitis C was brought in by law enforcement for in tensional ingestion of razor blades. He did this to harm himself he has had multiple suicide attempts previously. He feels complaints of depression and he just wanted to kill himself. He has had hospital admissions for similar presentation. We are monitoring serial KUBs as it keeps showing more metal pieces then original imaging. Patient continued to remain suicidal and psych recommended inpatient admission once medically clear.  Subjective: He was complaining of abdominal pain, stating that nothing is helping.  Denies any nausea or vomiting.  Does not want to drink colon prep.  After some argument agrees to continue drinking with [employer].  Had 1 bowel movement last night.  Remains suicidal.  Assessment & Plan:   Active Problems:   Ingestion of foreign body   Intentional self-harm by razor blade (HCC)   Foreign body aspiration, sequela   Major depressive disorder, recurrent episode, moderate (HCC)  Razor blade ingestion with suicidal attempt.  Serial KUB showing more pieces in his colon, although progressing.  Patient denies any more ingestion of any foreign object since then.  Patient is accompanied by a sitter and two Designer, television/film set in the room. -GI is following-we appreciate their recommendations. -Continue good bowel regimen and serial KUB until it clears. -Give her one-time dose of Dilaudid for worsening abdominal pain. -K pad. -Continue OxyIR and Toradol for pain.  Depression with suicidal ideation.  Psychiatry is following, recommending inpatient psych admit once medically stable. -Continue Zoloft and prazosin as recommended by psych.  Objective: Vitals:   05/11/19 1329 05/11/19 2010 05/12/19 0612 05/12/19 1425  BP: 129/84 124/79 119/81  128/82  Pulse: 90 87 71 88  Resp: Temp: 98.2 F (36.8 C) 97.6 F (36.4 C) 97.8 F (36.6 C)   TempSrc:  Oral Oral   SpO2: 98% 98% 97% 97%  Weight:      Height:        Intake/Output Summary (Last 24 hours) at 05/12/2019 1554 Last data filed at 05/11/2019 2300 Gross per 24 hour  Intake 720 ml  Output -  Net 720 ml   Filed Weights   05/08/19 1900  Weight: 72.6 kg    Examination:  General exam: Appears calm and comfortable. Respiratory system: Clear to auscultation. Respiratory effort normal. Cardiovascular system: S1 & S2 heard, RRR. No JVD, murmurs, rubs, gallops or clicks. No pedal edema. Gastrointestinal system: Abdomen soft with voluntary guarding, bowel sounds positive. Central nervous system: Alert and oriented. No focal neurological deficits. Extremities: Symmetric 5 x 5 power. Skin: No rashes, lesions or ulcers Psychiatry: Judgement and insight appear normal. Mood & affect appropriate.    DVT prophylaxis:  Code Status:  Family Communication:  Disposition Plan:   Consultants:   GI  Psychiatry  Data Reviewed: I have personally reviewed following labs and imaging studies  CBC: Recent Labs  Lab 05/07/19 1548 05/08/19 0518  WBC 9.4 7.4  HGB 16.2 14.8  HCT 49.7 45.3  MCV 92.2 91.7  PLT 311 267   Basic Metabolic Panel: Recent Labs  Lab 05/07/19 1548 05/08/19 0518  NA 140 139  K 4.5 3.7  CL 103 107  CO2 29 25  GLUCOSE 102* 91  BUN 7 7  CREATININE 0.84 0.70  CALCIUM 8.9 8.7*   GFR: Estimated Creatinine Clearance: 131.3  mL/min (by C-G formula based on SCr of 0.7 mg/dL). Liver Function Tests: Recent Labs  Lab 05/07/19 1548 05/08/19 0518  AST 15 12*  ALT 11 10  ALKPHOS 67 61  BILITOT 1.0 1.1  PROT 7.2 6.4*  ALBUMIN 4.4 3.8   No results for input(s): LIPASE, AMYLASE in the last 168 hours. No results for input(s): AMMONIA in the last 168 hours. Coagulation Profile: No results for input(s): INR, PROTIME in the last 168 hours.  Cardiac Enzymes: No results for input(s): CKTOTAL, CKMB, CKMBINDEX, TROPONINI in the last 168 hours. BNP (last 3 results) No results for input(s): PROBNP in the last 8760 hours. HbA1C: No results for input(s): HGBA1C in the last 72 hours. CBG: No results for input(s): GLUCAP in the last 168 hours. Lipid Profile: No results for input(s): CHOL, HDL, LDLCALC, TRIG, CHOLHDL, LDLDIRECT in the last 72 hours. Thyroid Function Tests: No results for input(s): TSH, T4TOTAL, FREET4, T3FREE, THYROIDAB in the last 72 hours. Anemia Panel: No results for input(s): VITAMINB12, FOLATE, FERRITIN, TIBC, IRON, RETICCTPCT in the last 72 hours. Sepsis Labs: No results for input(s): PROCALCITON, LATICACIDVEN in the last 168 hours.  Recent Results (from the past 240 hour(s))  SARS Coronavirus 2 by RT PCR (hospital order, performed in [facility] hospital lab) Nasopharyngeal Nasopharyngeal Swab     Status: None   Collection Time: 05/07/19  5:44 PM   Specimen: Nasopharyngeal Swab  Result Value Ref Range Status   SARS Coronavirus 2 NEGATIVE NEGATIVE Final    Comment: (NOTE) If result is NEGATIVE SARS-CoV-2 target nucleic acids are NOT DETECTED. The SARS-CoV-2 RNA is generally detectable in upper and lower  respiratory specimens during the acute phase of infection. The lowest  concentration of SARS-CoV-2 viral copies this assay can detect is 250  copies / mL. A negative result does not preclude SARS-CoV-2 infection  and should not be used as the sole basis for treatment or other  patient management decisions.  A negative result may occur with  improper specimen collection / handling, submission of specimen other  than nasopharyngeal swab, presence of viral mutation(s) within the  areas targeted by this assay, and inadequate number of viral copies  (<250 copies / mL). A negative result must be combined with clinical  observations, patient history, and epidemiological information. If result is POSITIVE  SARS-CoV-2 target nucleic acids are DETECTED. The SARS-CoV-2 RNA is generally detectable in upper and lower  respiratory specimens dur ing the acute phase of infection.  Positive  results are indicative of active infection with SARS-CoV-2.  Clinical  correlation with patient history and other diagnostic information is  necessary to determine patient infection status.  Positive results do  not rule out bacterial infection or co-infection with other viruses. If result is PRESUMPTIVE POSTIVE SARS-CoV-2 nucleic acids MAY BE PRESENT.   A presumptive positive result was obtained on the submitted specimen  and confirmed on repeat testing.  While 2019 novel coronavirus  (SARS-CoV-2) nucleic acids may be present in the submitted sample  additional confirmatory testing may be necessary for epidemiological  and / or clinical management purposes  to differentiate between  SARS-CoV-2 and other Sarbecovirus currently known to infect humans.  If clinically indicated additional testing with an alternate test  methodology [phone]) is advised. The SARS-CoV-2 RNA is generally  detectable in upper and lower respiratory sp ecimens during the acute  phase of infection. The expected result is Negative. Fact Sheet for Patients:  StrictlyIdeas.no Fact Sheet for Healthcare Providers: BankingDealers.co.za This test  is not yet approved or cleared by the [place] FDA and has been authorized for detection and/or diagnosis of SARS-CoV-2 by FDA under an Emergency Use Authorization (EUA).  This EUA will remain in effect (meaning this test can be used) for the duration of the COVID-19 declaration under Section 564(b)(1) of the Act, 21 U.S.C. section 360bbb-3(b)(1), unless the authorization is terminated or revoked sooner. Performed at [facility], [address] [place], [place] [address]   MRSA PCR Screening     Status: Abnormal    Collection Time: 05/07/19  8:27 PM   Specimen: Nasal Mucosa; Nasopharyngeal  Result Value Ref Range Status   MRSA by PCR POSITIVE (A) NEGATIVE Final    Comment:        The GeneXpert MRSA Assay (FDA approved for NASAL specimens only), is one component of a comprehensive MRSA colonization surveillance program. It is not intended to diagnose MRSA infection nor to guide or monitor treatment for MRSA infections. RESULT CALLED TO, READ BACK BY AND VERIFIED WITH: P,ALLEN AT 0157 ON 05/08/19 BY A,MOHAMED Performed at [facility], [address] [place], [place] [address]      Radiology Studies: Dg Abd 1 View  Result Date: 05/11/2019 CLINICAL DATA:  Ingestion of radiopaque foreign body. EXAM: ABDOMEN - 1 VIEW COMPARISON:  05/10/2019; 05/09/2019 FINDINGS: Interval increase in number of linear radiopaque foreign bodies, previously 5 radiopaque foreign bodies on the 05/10/2019, 3 were identified on the 05/09/2019 examination, while currently there are 9 radiopaque foreign bodies are identified the majority of which appear to be located within the cecum and ascending colon. Dominant radiopaque foreign body measures approximately 2.4 x 0.2 cm. Nonobstructive bowel gas pattern. Nondiagnostic evaluation for pneumoperitoneum secondary to supine positioning and exclusion of the lower thorax. No pneumatosis or portal venous gas. No acute osseous abnormalities. Note is made of a small right-sided os acetabuli. IMPRESSION: Interval increase in number of radiopaque foreign bodies, all of which overlie the expected location the cecum and ascending colon. Findings suggestive of continued foreign body ingestion. Electronically Signed   By: Simonne ComeJohn  Watts M.D.   On: 05/11/2019 07:58   Dg Abd 2 Views  Result Date: 05/12/2019 CLINICAL DATA:  33 year old male with follow-up of foreign body in the digestive system. EXAM: ABDOMEN - 2 VIEW COMPARISON:  Radiograph dated 05/11/2019. FINDINGS: Multiple  linear radiopaque foreign object noted over the abdomen some of which are over the expected location of the cecum and ascending colon and two of which have likely progressed to the distal transverse colon. Several other radiopaque foreign object overlie the pelvis, likely in the region of the rectosigmoid. No definite free air identified. There is no evidence of bowel obstruction. The osseous structures and soft tissues are grossly unremarkable. IMPRESSION: 1. Multiple linear radiopaque foreign objects, several of which have progressed within the bowel and likely located in the distal transverse colon and in the rectosigmoid. 2. No evidence of bowel perforation or obstruction. Electronically Signed   By: Elgie CollardArash  Radparvar M.D.   On: 05/12/2019 10:42    Scheduled Meds: . Chlorhexidine Gluconate Cloth  6 each Topical Q0600  . docusate sodium  200 mg Oral BID  . linaclotide  145 mcg Oral QAC breakfast  . mupirocin ointment  1 application Nasal BID  . polyethylene glycol-electrolytes  4,000 mL Oral Once  . prazosin  2 mg Oral QHS  . sertraline  25 mg Oral Daily   Continuous Infusions: . sodium chloride 150 mL/hr at  05/08/19 2203     LOS: 5 days   Time spent: 40 minutes  Arnetha Courser, MD [facility] Pager [phone]  If 7PM-7AM, please contact night-coverage [email or web address] Password [facility] 05/12/2019, 3:54 PM   This record has been created using Dragon voice recognition software. Errors have been sought and corrected,but may not always be located. Such creation errors do not reflect on the standard of care.

## 2019-05-12 NOTE — Progress Notes (Addendum)
[facility] Progress Note  Shem Plemmons 33 y.o. [birth year]  CC: Foreign body ingestion   Subjective: -Patient seen and examined at bedside.  He only drank 2 glasses of colon prep.  Denies any bleeding.  Continues to have abdominal discomfort.  ROS : Negative for chest pain and shortness of breath   Objective: Vital signs in last 24 hours: Vitals:   05/11/19 2010 05/12/19 0612  BP: 124/79 119/81  Pulse: 87 71  Resp: 18 18  Temp: 97.6 F (36.4 C) 97.8 F (36.6 C)  SpO2: 98% 97%    Physical Exam:  General.  Well developed, not in acute distress, in restraints Abdomen.  Soft, generalized discomfort on palpation, bowel sounds present.  Guarding noted but no rebound tenderness. Neuro.  Alert/oriented x3 Psych.  He is anxious.  Lab Results: No results for input(s): NA, K, CL, CO2, GLUCOSE, BUN, CREATININE, CALCIUM, MG, PHOS in the last 72 hours. No results for input(s): AST, ALT, ALKPHOS, BILITOT, PROT, ALBUMIN in the last 72 hours. No results for input(s): WBC, NEUTROABS, HGB, HCT, MCV, PLT in the last 72 hours. No results for input(s): LABPROT, INR in the last 72 hours.    Assessment/Plan: -Multiple foreign body ingestion/razor blade   X-ray now showing more foreign bodies than initial admission.  -Constipation.  Improving.   Recommendations --------------------------- -Patient is declining to drink colon prep.  Also declining to drink MiraLAX. -We will start Linzess 145 mcg  -Follow repeat x-ray -GI will follow   Otis Brace MD, [place] 05/12/2019, 9:36 AM  Contact #  [phone]

## 2019-05-12 NOTE — TOC Progression Note (Signed)
Transition of Care [facility]) - Progression Note    Patient Details  Name: Christopher Stephenson MRN: [id number] Date of Birth: 08-May-1986  Transition of Care [facility]) CM/SW [place], [place] Phone Number: 05/12/2019, 10:26 AM  Clinical Narrative:   Based on recommendation of referral to [facility] by psychiatry yesterday, tried calling Mr Saralyn Pilar who is the responsible federal judicial person for the [phone].  Received call back from Bayard Males who plans to come later this afternoon to speak with prisoner/patient and find out first hand what is going on. CSW will meet with him if he gets here in time.  If not, will follow up with phone call. TOC will continue to follow during the course of hospitalization.     Expected [facility]: [facility] Barriers to Discharge: No Barriers Identified  Expected Discharge Plan and Services Expected Discharge Plan: [place]                                               Social Determinants of Health (SDOH) Interventions    Readmission Risk Interventions No flowsheet data found.

## 2019-05-13 ENCOUNTER — Inpatient Hospital Stay (HOSPITAL_COMMUNITY)

## 2019-05-13 NOTE — Progress Notes (Signed)
[facility] Progress Note  Christopher Stephenson 33 y.o. 02-May-1986  CC: Foreign body ingestion   Subjective: Seen and examined at bedside.  Feeling somewhat better today.  Tolerating diet.  ROS : Negative for chest pain and shortness of breath   Objective: Vital signs in last 24 hours: Vitals:   05/12/19 1945 05/13/19 0437  BP: 128/71 113/70  Pulse: 81 69  Resp: 18 16  Temp: 98.7 F (37.1 C) 97.6 F (36.4 C)  SpO2: 99% 98%    Physical Exam:  General.  Well developed, not in acute distress, in restraints Abdomen.  Soft, nontender, nondistended bowel sounds present.  No peritoneal signs  neuro.  Alert/oriented x3 Psych.  He is anxious.  Lab Results: No results for input(s): NA, K, CL, CO2, GLUCOSE, BUN, CREATININE, CALCIUM, MG, PHOS in the last 72 hours. No results for input(s): AST, ALT, ALKPHOS, BILITOT, PROT, ALBUMIN in the last 72 hours. No results for input(s): WBC, NEUTROABS, HGB, HCT, MCV, PLT in the last 72 hours. No results for input(s): LABPROT, INR in the last 72 hours.    Assessment/Plan: -Multiple foreign body ingestion/razor blade   X-ray now showing more foreign bodies than initial admission.  -Constipation.  Improving.   Recommendations --------------------------- -X-ray image reviewed personally.  Looks like some of foreign bodies particularly razor blades have passed. -Tolerating Linzess well.  Continue  for now. -Repeat x-ray daily. -GI will follow periodically   Otis Brace MD, [place] 05/13/2019, 9:16 AM  Contact #  [phone]

## 2019-05-13 NOTE — Progress Notes (Signed)
Paged Floor coverage to renew order for safety sitter. Waiting for new order.

## 2019-05-13 NOTE — Progress Notes (Signed)
PROGRESS NOTE    Christopher SartoriusVernon Stephenson  ZOX:[id number] DOB: [birth year] DOA: 05/07/2019 PCP: Patient, No Pcp Per   Brief Narrative:  33 y.o.malewith medical history significant ofhepatitis C was brought in by law enforcement for in tensional ingestion of razor blades. He did this to harm himself he has had multiple suicide attempts previously. He feels complaints of depression and he just wanted to kill himself. He has had hospital admissions for similar presentation. We are monitoring serial KUBs as it keeps showing more metal pieces then original imaging. Patient continued to remain suicidal and psych recommended inpatient admission once medically clear.  Subjective: Patient was feeling better when seen this morning.  Continues to experience some abdominal pain, stating that Dilaudid is working and he was able to sleep last night.  Had couple of bowel movements.  He promised to drink some more colon prep.  Assessment & Plan:   Active Problems:   Ingestion of foreign body   Intentional self-harm by razor blade (HCC)   Foreign body aspiration, sequela   Major depressive disorder, recurrent episode, moderate (HCC)  Razor blade ingestion with suicidal attempt.  Serial KUB showing progression of moderate feces in bowel. Patient is accompanied by a sitter and two Designer, television/film setlaw enforcement officers in the room. -GI is following-we appreciate their recommendations. -Continue good bowel regimen and serial KUB until it clears. -Continue K pad. -Continue Dilaudid as needed for pain. -Continue OxyIR and Toradol for pain.  Depression with suicidal ideation.  Psychiatry is following, recommending inpatient psych admit once medically stable. -Continue Zoloft and prazosin as recommended by psych.  Objective: Vitals:   05/12/19 1425 05/12/19 1945 05/13/19 0437 05/13/19 1452  BP: 128/82 128/71 113/70 127/79  Pulse: 88 81 69 78  Resp:  18 16 14   Temp:  98.7 F (37.1 C) 97.6 F (36.4 C) 98.3 F (36.8 C)   TempSrc:  Oral  Oral  SpO2: 97% 99% 98% 97%  Weight:      Height:        Intake/Output Summary (Last 24 hours) at 05/13/2019 1514 Last data filed at 05/13/2019 1300 Gross per 24 hour  Intake 2712.21 ml  Output -  Net 2712.21 ml   Filed Weights   05/08/19 1900  Weight: 72.6 kg    Examination:  General exam: Appears calm and comfortable  Respiratory system: Clear to auscultation. Respiratory effort normal. Cardiovascular system: S1 & S2 heard, RRR. No JVD, murmurs, rubs, gallops or clicks. No pedal edema. Gastrointestinal system: Abdomen is nondistended, soft and nontender. No organomegaly or masses felt. Normal bowel sounds heard. Central nervous system: Alert and oriented. No focal neurological deficits. Extremities: Symmetric 5 x 5 power. Skin: No rashes, lesions or ulcers Psychiatry: Judgement and insight appear normal. Mood & affect appropriate.   DVT prophylaxis: SCDs Code Status: Full Family Communication: No family at bedside.  To [employer] and a sitter present. Disposition Plan: Pending improvement and clearing his bowel.  Most likely go back to jail/inpatient psych.  Consultants:   Psych  Procedures:  Antimicrobials:   Data Reviewed: I have personally reviewed following labs and imaging studies  CBC: Recent Labs  Lab 05/07/19 1548 05/08/19 0518  WBC 9.4 7.4  HGB 16.2 14.8  HCT 49.7 45.3  MCV 92.2 91.7  PLT 311 267   Basic Metabolic Panel: Recent Labs  Lab 05/07/19 1548 05/08/19 0518  NA 140 139  K 4.5 3.7  CL 103 107  CO2 29 25  GLUCOSE 102* 91  BUN 7 7  CREATININE 0.84 0.70  CALCIUM 8.9 8.7*   GFR: Estimated Creatinine Clearance: 131.3 mL/min (by C-G formula based on SCr of 0.7 mg/dL). Liver Function Tests: Recent Labs  Lab 05/07/19 1548 05/08/19 0518  AST 15 12*  ALT 11 10  ALKPHOS 67 61  BILITOT 1.0 1.1  PROT 7.2 6.4*  ALBUMIN 4.4 3.8   No results for input(s): LIPASE, AMYLASE in the last 168 hours. No results for  input(s): AMMONIA in the last 168 hours. Coagulation Profile: No results for input(s): INR, PROTIME in the last 168 hours. Cardiac Enzymes: No results for input(s): CKTOTAL, CKMB, CKMBINDEX, TROPONINI in the last 168 hours. BNP (last 3 results) No results for input(s): PROBNP in the last 8760 hours. HbA1C: No results for input(s): HGBA1C in the last 72 hours. CBG: No results for input(s): GLUCAP in the last 168 hours. Lipid Profile: No results for input(s): CHOL, HDL, LDLCALC, TRIG, CHOLHDL, LDLDIRECT in the last 72 hours. Thyroid Function Tests: No results for input(s): TSH, T4TOTAL, FREET4, T3FREE, THYROIDAB in the last 72 hours. Anemia Panel: No results for input(s): VITAMINB12, FOLATE, FERRITIN, TIBC, IRON, RETICCTPCT in the last 72 hours. Sepsis Labs: No results for input(s): PROCALCITON, LATICACIDVEN in the last 168 hours.  Recent Results (from the past 240 hour(s))  SARS Coronavirus 2 by RT PCR (hospital order, performed in [facility] hospital lab) Nasopharyngeal Nasopharyngeal Swab     Status: None   Collection Time: 05/07/19  5:44 PM   Specimen: Nasopharyngeal Swab  Result Value Ref Range Status   SARS Coronavirus 2 NEGATIVE NEGATIVE Final    Comment: (NOTE) If result is NEGATIVE SARS-CoV-2 target nucleic acids are NOT DETECTED. The SARS-CoV-2 RNA is generally detectable in upper and lower  respiratory specimens during the acute phase of infection. The lowest  concentration of SARS-CoV-2 viral copies this assay can detect is 250  copies / mL. A negative result does not preclude SARS-CoV-2 infection  and should not be used as the sole basis for treatment or other  patient management decisions.  A negative result may occur with  improper specimen collection / handling, submission of specimen other  than nasopharyngeal swab, presence of viral mutation(s) within the  areas targeted by this assay, and inadequate number of viral copies  (<250 copies / mL). A negative result  must be combined with clinical  observations, patient history, and epidemiological information. If result is POSITIVE SARS-CoV-2 target nucleic acids are DETECTED. The SARS-CoV-2 RNA is generally detectable in upper and lower  respiratory specimens dur ing the acute phase of infection.  Positive  results are indicative of active infection with SARS-CoV-2.  Clinical  correlation with patient history and other diagnostic information is  necessary to determine patient infection status.  Positive results do  not rule out bacterial infection or co-infection with other viruses. If result is PRESUMPTIVE POSTIVE SARS-CoV-2 nucleic acids MAY BE PRESENT.   A presumptive positive result was obtained on the submitted specimen  and confirmed on repeat testing.  While 2019 novel coronavirus  (SARS-CoV-2) nucleic acids may be present in the submitted sample  additional confirmatory testing may be necessary for epidemiological  and / or clinical management purposes  to differentiate between  SARS-CoV-2 and other Sarbecovirus currently known to infect humans.  If clinically indicated additional testing with an alternate test  methodology [phone]) is advised. The SARS-CoV-2 RNA is generally  detectable in upper and lower respiratory sp ecimens during the acute  phase of infection. The expected result is Negative.  Fact Sheet for Patients:  StrictlyIdeas.no Fact Sheet for Healthcare Providers: BankingDealers.co.za This test is not yet approved or cleared by the [place] FDA and has been authorized for detection and/or diagnosis of SARS-CoV-2 by FDA under an Emergency Use Authorization (EUA).  This EUA will remain in effect (meaning this test can be used) for the duration of the COVID-19 declaration under Section 564(b)(1) of the Act, 21 U.S.C. section 360bbb-3(b)(1), unless the authorization is terminated or revoked sooner. Performed at [facility], [place] [address], [place], [place] [address]   MRSA PCR Screening     Status: Abnormal   Collection Time: 05/07/19  8:27 PM   Specimen: Nasal Mucosa; Nasopharyngeal  Result Value Ref Range Status   MRSA by PCR POSITIVE (A) NEGATIVE Final    Comment:        The GeneXpert MRSA Assay (FDA approved for NASAL specimens only), is one component of a comprehensive MRSA colonization surveillance program. It is not intended to diagnose MRSA infection nor to guide or monitor treatment for MRSA infections. RESULT CALLED TO, READ BACK BY AND VERIFIED WITH: P,ALLEN AT 0157 ON 05/08/19 BY A,MOHAMED Performed at [facility] [address] [place], [place] [address]      Radiology Studies: Dg Abd 2 Views  Result Date: 05/12/2019 CLINICAL DATA:  33 year old male with follow-up of foreign body in the digestive system. EXAM: ABDOMEN - 2 VIEW COMPARISON:  Radiograph dated 05/11/2019. FINDINGS: Multiple linear radiopaque foreign object noted over the abdomen some of which are over the expected location of the cecum and ascending colon and two of which have likely progressed to the distal transverse colon. Several other radiopaque foreign object overlie the pelvis, likely in the region of the rectosigmoid. No definite free air identified. There is no evidence of bowel obstruction. The osseous structures and soft tissues are grossly unremarkable. IMPRESSION: 1. Multiple linear radiopaque foreign objects, several of which have progressed within the bowel and likely located in the distal transverse colon and in the rectosigmoid. 2. No evidence of bowel perforation or obstruction. Electronically Signed   By: Anner Crete M.D.   On: 05/12/2019 10:42   Dg Abd Portable 2v  Result Date: 05/13/2019 CLINICAL DATA:  Foreign body in digestive system. EXAM: PORTABLE ABDOMEN - 2 VIEW COMPARISON:  05/12/2019 FINDINGS: Supine and left -side up decubitus views. The supine view  demonstrates multiple radiopaque foreign objects, now projecting over the right-side of the abdomen. This suggests these are in the ascending colon today and were in distal small bowel loops on the prior. A single radiopaque object projects over the left upper quadrant, likely in the splenic flexure. The pelvis is excluded from the supine view. No bowel obstruction or gross free intraperitoneal air. No significant air-fluid levels on decubitus imaging. IMPRESSION: Radiopaque foreign objects, felt to be within the colon, suggesting progression since yesterday's exam. No evidence of free intraperitoneal air. Electronically Signed   By: Abigail Miyamoto M.D.   On: 05/13/2019 07:00    Scheduled Meds: . docusate sodium  200 mg Oral BID  . linaclotide  145 mcg Oral QAC breakfast  . polyethylene glycol-electrolytes  4,000 mL Oral Once  . prazosin  2 mg Oral QHS  . sertraline  25 mg Oral Daily   Continuous Infusions: . sodium chloride 150 mL/hr at 05/13/19 0235     LOS: 6 days   Time spent: 30 minutes.  Lorella Nimrod, MD [facility] Pager [phone]  If 7PM-7AM, please  contact night-coverage [email or web address] Password [facility] 05/13/2019, 3:14 PM   This record has been created using Dragon voice recognition software. Errors have been sought and corrected,but may not always be located. Such creation errors do not reflect on the standard of care.

## 2019-05-13 NOTE — Progress Notes (Signed)
After reviewing X Ray. There was some concern for new digestion of metal substances. After talking to patient he informed RN, three days prior to admission he was consuming pieces of batteries that he stripped. 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

## 2019-05-13 NOTE — TOC Progression Note (Signed)
Transition of Care [facility]) - Progression Note    Patient Details  Name: Christopher Stephenson MRN: [id number] Date of Birth: Apr 19, 1986  Transition of Care [facility]) CM/SW [place], [place] Phone Number: 05/13/2019, 2:55 PM  Clinical Narrative:   Attempted to contact [name], with whom I spoke yesterday.  His phone number comes up as "No caller ID" on my phone, and I am unable to call.  I stopped by patient's room to ask [employer] to send him a message to contact me, and they did.  One of them mentioned that they had heard he would be transferred to a different place at d/c, but they could not confirm that.   Received call back from [name] just now.  When patient is stable for d/c, he will be transferred to [facility].  I checked with [name], who agreed that this is good news as he had been there previously, and felt like his emotional and mental health needs were met there.  He states he will be happy to take meds and comply with expectations once there.  Once there, his hearing can be expedited, and he will return to [employer] [facility].  Again, he is familiar with this setting and knows he will get good care there.  Apparently, his intent in coming here was to get his venue changed, and he was successful.  TOC will continue to follow during the course of hospitalization.     Expected Discharge Plan: [facility]rections Facility Barriers to Discharge: No Barriers Identified  Expected Discharge Plan and Services Expected Discharge Plan: [place]                                               Social Determinants of Health (SDOH) Interventions    Readmission Risk Interventions No flowsheet data found.

## 2019-05-14 ENCOUNTER — Inpatient Hospital Stay (HOSPITAL_COMMUNITY)

## 2019-05-14 NOTE — Progress Notes (Signed)
PROGRESS NOTE    Christopher Stephenson  FUX:[id number] DOB: 1986/04/28 DOA: 05/07/2019 PCP: Patient, No Pcp Per   Brief Narrative:  33 y.o.malewith medical history significant ofhepatitis C was brought in by law enforcement for in tensional ingestion of razor blades. He did this to harm himself he has had multiple suicide attempts previously. He feels complaints of depression and he just wanted to kill himself. He has had hospital admissions for similar presentation. We are monitoring serial KUBs as it keeps showing more metal pieces then originalimaging. Patient continued to remain suicidal and psych recommended inpatient admission once medically clear. He might go back to [place] [facility] on discharge.  Subjective: Patient was resting comfortably when seen this morning.  Pain is improving.  Did get few bowel movements.  Assessment & Plan:   Active Problems:   Ingestion of foreign body   Intentional self-harm by razor blade ([place])   Foreign body aspiration, sequela   Major depressive disorder, recurrent episode, moderate ([place])  Razor blade ingestion with suicidal attempt.Serial KUB showing progression of moderate feces in bowel.Patient is accompanied by a sitter and two Administrator in the room. Current pain regimen seems working. -GI is following-we appreciate their recommendations. -Continue good bowel regimen and serial KUB until it clears. -Continue K pad. -Continue Dilaudid as needed for pain. -Continue OxyIR and Toradol for pain.  Depression with suicidal ideation.Psychiatry is following,recommending inpatient psych admit once medically stable. -Continue Zoloft and prazosin as recommended by psych.  Objective: Vitals:   05/13/19 1452 05/13/19 2044 05/14/19 0421 05/14/19 1358  BP: 127/79 122/73 132/87 121/73  Pulse: 78 72 81 78  Resp: 14 16 16 18   Temp: 98.3 F (36.8 C) 98.5 F (36.9 C) 98.2 F (36.8 C) 98.2 F (36.8 C)  TempSrc: Oral Oral Oral Oral   SpO2: 97% 98% 98% 96%  Weight:      Height:        Intake/Output Summary (Last 24 hours) at 05/14/2019 1627 Last data filed at 05/14/2019 1224 Gross per 24 hour  Intake 1080 ml  Output -  Net 1080 ml   Filed Weights   05/08/19 1900  Weight: 72.6 kg   Examination:  General exam: Appears calm and comfortable  Respiratory system: Clear to auscultation. Respiratory effort normal. Cardiovascular system: S1 & S2 heard, RRR. No JVD, murmurs, rubs, gallops or clicks. No pedal edema. Gastrointestinal system: Abdomen is nondistended, soft and nontender. No organomegaly or masses felt. Normal bowel sounds heard. Central nervous system: Alert and oriented. No focal neurological deficits. Extremities: Symmetric 5 x 5 power. Skin: No rashes, lesions or ulcers Psychiatry: Judgement and insight appear normal. Mood & affect appropriate.   DVT prophylaxis: SCDs Code Status: Full Family Communication: No family at bedside.  To [employer] and a sitter present. Disposition Plan: Pending improvement and clearing his bowel.  Most likely go back to jail/inpatient psych.  Consultants:   Psych   Data Reviewed: I have personally reviewed following labs and imaging studies  CBC: Recent Labs  Lab 05/08/19 0518  WBC 7.4  HGB 14.8  HCT 45.3  MCV 91.7  PLT 025   Basic Metabolic Panel: Recent Labs  Lab 05/08/19 0518  NA 139  K 3.7  CL 107  CO2 25  GLUCOSE 91  BUN 7  CREATININE 0.70  CALCIUM 8.7*   GFR: Estimated Creatinine Clearance: 131.3 mL/min (by C-G formula based on SCr of 0.7 mg/dL). Liver Function Tests: Recent Labs  Lab 05/08/19 0518  AST 12*  ALT 10  ALKPHOS 61  BILITOT 1.1  PROT 6.4*  ALBUMIN 3.8   No results for input(s): LIPASE, AMYLASE in the last 168 hours. No results for input(s): AMMONIA in the last 168 hours. Coagulation Profile: No results for input(s): INR, PROTIME in the last 168 hours. Cardiac Enzymes: No results for input(s): CKTOTAL, CKMB,  CKMBINDEX, TROPONINI in the last 168 hours. BNP (last 3 results) No results for input(s): PROBNP in the last 8760 hours. HbA1C: No results for input(s): HGBA1C in the last 72 hours. CBG: No results for input(s): GLUCAP in the last 168 hours. Lipid Profile: No results for input(s): CHOL, HDL, LDLCALC, TRIG, CHOLHDL, LDLDIRECT in the last 72 hours. Thyroid Function Tests: No results for input(s): TSH, T4TOTAL, FREET4, T3FREE, THYROIDAB in the last 72 hours. Anemia Panel: No results for input(s): VITAMINB12, FOLATE, FERRITIN, TIBC, IRON, RETICCTPCT in the last 72 hours. Sepsis Labs: No results for input(s): PROCALCITON, LATICACIDVEN in the last 168 hours.  Recent Results (from the past 240 hour(s))  SARS Coronavirus 2 by RT PCR (hospital order, performed in [facility] hospital lab) Nasopharyngeal Nasopharyngeal Swab     Status: None   Collection Time: 05/07/19  5:44 PM   Specimen: Nasopharyngeal Swab  Result Value Ref Range Status   SARS Coronavirus 2 NEGATIVE NEGATIVE Final    Comment: (NOTE) If result is NEGATIVE SARS-CoV-2 target nucleic acids are NOT DETECTED. The SARS-CoV-2 RNA is generally detectable in upper and lower  respiratory specimens during the acute phase of infection. The lowest  concentration of SARS-CoV-2 viral copies this assay can detect is 250  copies / mL. A negative result does not preclude SARS-CoV-2 infection  and should not be used as the sole basis for treatment or other  patient management decisions.  A negative result may occur with  improper specimen collection / handling, submission of specimen other  than nasopharyngeal swab, presence of viral mutation(s) within the  areas targeted by this assay, and inadequate number of viral copies  (<250 copies / mL). A negative result must be combined with clinical  observations, patient history, and epidemiological information. If result is POSITIVE SARS-CoV-2 target nucleic acids are DETECTED. The SARS-CoV-2  RNA is generally detectable in upper and lower  respiratory specimens dur ing the acute phase of infection.  Positive  results are indicative of active infection with SARS-CoV-2.  Clinical  correlation with patient history and other diagnostic information is  necessary to determine patient infection status.  Positive results do  not rule out bacterial infection or co-infection with other viruses. If result is PRESUMPTIVE POSTIVE SARS-CoV-2 nucleic acids MAY BE PRESENT.   A presumptive positive result was obtained on the submitted specimen  and confirmed on repeat testing.  While 2019 novel coronavirus  (SARS-CoV-2) nucleic acids may be present in the submitted sample  additional confirmatory testing may be necessary for epidemiological  and / or clinical management purposes  to differentiate between  SARS-CoV-2 and other Sarbecovirus currently known to infect humans.  If clinically indicated additional testing with an alternate test  methodology [phone]([id number]) is advised. The SARS-CoV-2 RNA is generally  detectable in upper and lower respiratory sp ecimens during the acute  phase of infection. The expected result is Negative. Fact Sheet for Patients:  BoilerBrush.com.cyhttps://www.fda.gov/media/136312/download Fact Sheet for Healthcare Providers: https://pope.com/https://www.fda.gov/media/136313/download This test is not yet approved or cleared by the [place] FDA and has been authorized for detection and/or diagnosis of SARS-CoV-2 by FDA under an Emergency Use Authorization (EUA).  This  EUA will remain in effect (meaning this test can be used) for the duration of the COVID-19 declaration under Section 564(b)(1) of the Act, 21 U.S.C. section 360bbb-3(b)(1), unless the authorization is terminated or revoked sooner. Performed at [facility], [address], [place], [place] [address]   MRSA PCR Screening     Status: Abnormal   Collection Time: 05/07/19  8:27 PM   Specimen: Nasal Mucosa;  Nasopharyngeal  Result Value Ref Range Status   MRSA by PCR POSITIVE (A) NEGATIVE Final    Comment:        The GeneXpert MRSA Assay (FDA approved for NASAL specimens only), is one component of a comprehensive MRSA colonization surveillance program. It is not intended to diagnose MRSA infection nor to guide or monitor treatment for MRSA infections. RESULT CALLED TO, READ BACK BY AND VERIFIED WITH: P,ALLEN AT 0157 ON 05/08/19 BY A,MOHAMED Performed at [facility], [address] [place], [place] [address]      Radiology Studies: Dg Abd Portable 2v  Result Date: 05/14/2019 CLINICAL DATA:  Foreign body ingestion. EXAM: PORTABLE ABDOMEN - 2 VIEW COMPARISON:  05/13/2019. FINDINGS: Antegrade progression of ingested radiopaque foreign bodies. There is now a foreign body within the hepatic flexure and mid descending colon. Remaining foreign bodies are noted at the hepatic flexure and mid transverse colon. No abnormal bowel dilatation. IMPRESSION: 1. Findings compatible with progression of ingested radiopaque foreign bodies. 2. Nonobstructive bowel gas pattern. Electronically Signed   By: Signa Kell M.D.   On: 05/14/2019 05:31   Dg Abd Portable 2v  Result Date: 05/13/2019 CLINICAL DATA:  Foreign body in digestive system. EXAM: PORTABLE ABDOMEN - 2 VIEW COMPARISON:  05/12/2019 FINDINGS: Supine and left -side up decubitus views. The supine view demonstrates multiple radiopaque foreign objects, now projecting over the right-side of the abdomen. This suggests these are in the ascending colon today and were in distal small bowel loops on the prior. A single radiopaque object projects over the left upper quadrant, likely in the splenic flexure. The pelvis is excluded from the supine view. No bowel obstruction or gross free intraperitoneal air. No significant air-fluid levels on decubitus imaging. IMPRESSION: Radiopaque foreign objects, felt to be within the colon, suggesting  progression since yesterday's exam. No evidence of free intraperitoneal air. Electronically Signed   By: Jeronimo Greaves M.D.   On: 05/13/2019 07:00    Scheduled Meds: . docusate sodium  200 mg Oral BID  . linaclotide  145 mcg Oral QAC breakfast  . polyethylene glycol-electrolytes  4,000 mL Oral Once  . prazosin  2 mg Oral QHS  . sertraline  25 mg Oral Daily   Continuous Infusions:   LOS: 7 days   Time spent: 25 minutes.  Arnetha Courser, MD [facility] Pager [phone]  If 7PM-7AM, please contact night-coverage [email or web address] Password [facility] 05/14/2019, 4:27 PM   This record has been created using Dragon voice recognition software. Errors have been sought and corrected,but may not always be located. Such creation errors do not reflect on the standard of care.

## 2019-05-15 ENCOUNTER — Inpatient Hospital Stay (HOSPITAL_COMMUNITY)

## 2019-05-15 NOTE — Progress Notes (Signed)
PROGRESS NOTE    Christopher Stephenson  YHC:[id number] DOB: 14-Oct-1985 DOA: 05/07/2019 PCP: Patient, No Pcp Per   Brief Narrative:  33 y.o.malewith medical history significant ofhepatitis C was brought in by law enforcement for in tensional ingestion of razor blades. He did this to harm himself he has had multiple suicide attempts previously. He feels complaints of depression and he just wanted to kill himself. He has had hospital admissions for similar presentation. We are monitoring serial KUBs as it keeps showing more metal pieces then originalimaging. Patient continued to remain suicidal and psych recommended inpatient admission once medically clear. He will go back to [facility] on discharge.  Subjective: No new complaint.  Continue to have abdominal pain.  Able to tolerate diet very well.  Having regular bowel movements.  Not drinking much of colon prep.  Assessment & Plan:   Active Problems:   Ingestion of foreign body   Intentional self-harm by razor blade (HCC)   Foreign body aspiration, sequela   Major depressive disorder, recurrent episode, moderate (HCC)  Razor blade ingestion with suicidal attempt.Serial KUB showingprogression of metal pieces in bowel.Patient is accompanied by a sitter and two Designer, television/film set in the room. Current pain regimen seems working. -GI is following-we appreciate their recommendations, there was no GI note over the weekend, I will try contacting GI again today if there is anything else to offer. -Continue good bowel regimen and serial KUB until it clears. -ContinueK pad. -Continue Dilaudid as needed for pain. -Continue OxyIR and Toradol for pain.  Depression with suicidal ideation.Psychiatry is following,recommending inpatient psych admit once medically stable. -Continue Zoloft and prazosin as recommended by psych.  Objective: Vitals:   05/14/19 0421 05/14/19 1358 05/14/19 2108 05/15/19 0627  BP: 132/87 121/73 (!) 135/93  119/77  Pulse: 81 78 72 67  Resp: 16 18 18 16   Temp: 98.2 F (36.8 C) 98.2 F (36.8 C) 98.6 F (37 C) 97.9 F (36.6 C)  TempSrc: Oral Oral Oral Oral  SpO2: 98% 96% 97% 98%  Weight:      Height:        Intake/Output Summary (Last 24 hours) at 05/15/2019 1431 Last data filed at 05/15/2019 1259 Gross per 24 hour  Intake 2060 ml  Output --  Net 2060 ml   Filed Weights   05/08/19 1900  Weight: 72.6 kg    Examination:  General exam: Appears calm and comfortable  Respiratory system: Clear to auscultation. Respiratory effort normal. Cardiovascular system: S1 & S2 heard, RRR. No JVD, murmurs, rubs, gallops or clicks. No pedal edema. Gastrointestinal system: Abdomen is nondistended, soft and nontender. No organomegaly or masses felt. Normal bowel sounds heard. Central nervous system: Alert and oriented. No focal neurological deficits. Extremities: Symmetric 5 x 5 power. Skin: No rashes, lesions or ulcers Psychiatry: Judgement and insight appear normal. Mood & affect appropriate.   DVT prophylaxis:SCDs Code Status:Full Family Communication:No family at bedside. One 13/08/20 and a sitter present. Disposition Plan:Pending improvement and clearing his bowel.Most likely go back to jail/inpatient psych.  Consultants:  Psych   Data Reviewed: I have personally reviewed following labs and imaging studies  CBC: No results for input(s): WBC, NEUTROABS, HGB, HCT, MCV, PLT in the last 168 hours. Basic Metabolic Panel: No results for input(s): NA, K, CL, CO2, GLUCOSE, BUN, CREATININE, CALCIUM, MG, PHOS in the last 168 hours. GFR: Estimated Creatinine Clearance: 131.3 mL/min (by C-G formula based on SCr of 0.7 mg/dL). Liver Function Tests: No results for input(s): AST, ALT, ALKPHOS,  BILITOT, PROT, ALBUMIN in the last 168 hours. No results for input(s): LIPASE, AMYLASE in the last 168 hours. No results for input(s): AMMONIA in the last 168 hours. Coagulation Profile: No  results for input(s): INR, PROTIME in the last 168 hours. Cardiac Enzymes: No results for input(s): CKTOTAL, CKMB, CKMBINDEX, TROPONINI in the last 168 hours. BNP (last 3 results) No results for input(s): PROBNP in the last 8760 hours. HbA1C: No results for input(s): HGBA1C in the last 72 hours. CBG: No results for input(s): GLUCAP in the last 168 hours. Lipid Profile: No results for input(s): CHOL, HDL, LDLCALC, TRIG, CHOLHDL, LDLDIRECT in the last 72 hours. Thyroid Function Tests: No results for input(s): TSH, T4TOTAL, FREET4, T3FREE, THYROIDAB in the last 72 hours. Anemia Panel: No results for input(s): VITAMINB12, FOLATE, FERRITIN, TIBC, IRON, RETICCTPCT in the last 72 hours. Sepsis Labs: No results for input(s): PROCALCITON, LATICACIDVEN in the last 168 hours.  Recent Results (from the past 240 hour(s))  SARS Coronavirus 2 by RT PCR (hospital order, performed [facility]) Nasopharyngeal Nasopharyngeal Swab     Status: None   Collection Time: 05/07/19  5:44 PM   Specimen: Nasopharyngeal Swab  Result Value Ref Range Status   SARS Coronavirus 2 NEGATIVE NEGATIVE Final    Comment: (NOTE) If result is NEGATIVE SARS-CoV-2 target nucleic acids are NOT DETECTED. The SARS-CoV-2 RNA is generally detectable in upper and lower  respiratory specimens during the acute phase of infection. The lowest  concentration of SARS-CoV-2 viral copies this assay can detect is 250  copies / mL. A negative result does not preclude SARS-CoV-2 infection  and should not be used as the sole basis for treatment or other  patient management decisions.  A negative result may occur with  improper specimen collection / handling, submission of specimen other  than nasopharyngeal swab, presence of viral mutation(s) within the  areas targeted by this assay, and inadequate number of viral copies  (<250 copies / mL). A negative result must be combined with clinical  observations, patient history, and  epidemiological information. If result is POSITIVE SARS-CoV-2 target nucleic acids are DETECTED. The SARS-CoV-2 RNA is generally detectable in upper and lower  respiratory specimens dur ing the acute phase of infection.  Positive  results are indicative of active infection with SARS-CoV-2.  Clinical  correlation with patient history and other diagnostic information is  necessary to determine patient infection status.  Positive results do  not rule out bacterial infection or co-infection with other viruses. If result is PRESUMPTIVE POSTIVE SARS-CoV-2 nucleic acids MAY BE PRESENT.   A presumptive positive result was obtained on the submitted specimen  and confirmed on repeat testing.  While 2019 novel coronavirus  (SARS-CoV-2) nucleic acids may be present in the submitted sample  additional confirmatory testing may be necessary for epidemiological  and / or clinical management purposes  to differentiate between  SARS-CoV-2 and other Sarbecovirus currently known to infect humans.  If clinically indicated additional testing with an alternate test  methodology [phone] is advised. The SARS-CoV-2 RNA is generally  detectable in upper and lower respiratory sp ecimens during the acute  phase of infection. The expected result is Negative. Fact Sheet for Patients:  BoilerBrush.com.cyhttps://www.fda.gov/media/136312/download Fact Sheet for Healthcare Providers: https://pope.com/https://www.fda.gov/media/136313/download This test is not yet approved or cleared by the [place] FDA and has been authorized for detection and/or diagnosis of SARS-CoV-2 by FDA under an Emergency Use Authorization (EUA).  This EUA will remain in effect (meaning this test  can be used) for the duration of the COVID-19 declaration under Section 564(b)(1) of the Act, 21 U.S.C. section 360bbb-3(b)(1), unless the authorization is terminated or revoked sooner. Performed at [facility], [place] [address] [place], [place] [address]     MRSA PCR Screening     Status: Abnormal   Collection Time: 05/07/19  8:27 PM   Specimen: Nasal Mucosa; Nasopharyngeal  Result Value Ref Range Status   MRSA by PCR POSITIVE (A) NEGATIVE Final    Comment:        The GeneXpert MRSA Assay (FDA approved for NASAL specimens only), is one component of a comprehensive MRSA colonization surveillance program. It is not intended to diagnose MRSA infection nor to guide or monitor treatment for MRSA infections. RESULT CALLED TO, READ BACK BY AND VERIFIED WITH: P,ALLEN AT 0157 ON 05/08/19 BY A,MOHAMED Performed at [facility] [address], [place], [place] [address]      Radiology Studies: Dg Abd Portable 2v  Result Date: 05/14/2019 CLINICAL DATA:  Foreign body ingestion. EXAM: PORTABLE ABDOMEN - 2 VIEW COMPARISON:  05/13/2019. FINDINGS: Antegrade progression of ingested radiopaque foreign bodies. There is now a foreign body within the hepatic flexure and mid descending colon. Remaining foreign bodies are noted at the hepatic flexure and mid transverse colon. No abnormal bowel dilatation. IMPRESSION: 1. Findings compatible with progression of ingested radiopaque foreign bodies. 2. Nonobstructive bowel gas pattern. Electronically Signed   By: Kerby Moors M.D.   On: 05/14/2019 05:31    Scheduled Meds:  docusate sodium  200 mg Oral BID   linaclotide  145 mcg Oral QAC breakfast   polyethylene glycol-electrolytes  4,000 mL Oral Once   prazosin  2 mg Oral QHS   sertraline  25 mg Oral Daily   Continuous Infusions:   LOS: 8 days   Time spent: 30 minutes.  Lorella Nimrod, MD [facility] Pager [phone]  If 7PM-7AM, please contact night-coverage [email or web address] Password [facility] 05/15/2019, 2:31 PM   This record has been created using Dragon voice recognition software. Errors have been sought and corrected,but may not always be located. Such creation errors do not reflect on the standard of care.

## 2019-05-16 ENCOUNTER — Inpatient Hospital Stay (HOSPITAL_COMMUNITY)

## 2019-05-16 MED ORDER — PEG 3350-KCL-NA BICARB-NACL 420 G PO SOLR
4000.0000 mL | Freq: Once | ORAL | Status: AC
  Administered 2019-05-16: 4000 mL via ORAL

## 2019-05-16 NOTE — Progress Notes (Signed)
PROGRESS NOTE    Bilal Manzer  YQM:[id number] DOB: 1985/12/08 DOA: 05/07/2019 PCP: Patient, No Pcp Per   Brief Narrative:  33 y.o.malewith medical history significant ofhepatitis C was brought in by law enforcement for in tensional ingestion of razor blades. He did this to harm himself he has had multiple suicide attempts previously. He feels complaints of depression and he just wanted to kill himself. He has had hospital admissions for similar presentation. We are monitoring serial KUBs as it keeps showing more metal pieces then originalimaging. Patient continued to remain suicidal and psych recommended inpatient admission once medically clear. He will go back to [facility] on discharge.  Subjective: No new complaint.  Continue to have abdominal pain.  He does not want to have colonoscopy yet and agreed to drink another gallon of colon prep.  Assessment & Plan:   Active Problems:   Ingestion of foreign body   Intentional self-harm by razor blade ([place])   Foreign body aspiration, sequela   Major depressive disorder, recurrent episode, moderate ([place])  Razor blade ingestion with suicidal attempt.Serial KUB showingprogression of metal pieces in bowel.Patient is accompanied by a sitter and two Administrator in the room. Current pain regimen seems working. -GI is following-we appreciate their recommendations. -Agreeable to drink another gallon of colon prep today, we will try that. -Continue good bowel regimen and serial KUB until it clears. -ContinueK pad. -Continue Dilaudid as needed for pain. -Continue OxyIR and Toradol for pain.  Depression with suicidal ideation.Psychiatry is following,recommending inpatient psych admit once medically stable. -Continue Zoloft and prazosin as recommended by psych.  Objective: Vitals:   05/15/19 0627 05/15/19 1528 05/15/19 2149 05/16/19 0424  BP: 119/77 123/83 131/73 132/80  Pulse: 67 79 70 77  Resp: 16 18 17 18    Temp: 97.9 F (36.6 C) 98.4 F (36.9 C) 99 F (37.2 C) 98 F (36.7 C)  TempSrc: Oral Oral Oral Oral  SpO2: 98% 97% 97% 99%  Weight:      Height:        Intake/Output Summary (Last 24 hours) at 05/16/2019 1023 Last data filed at 05/16/2019 0906 Gross per 24 hour  Intake 1435 ml  Output -  Net 1435 ml   Filed Weights   05/08/19 1900  Weight: 72.6 kg    Examination:  General exam: Appears calm and comfortable  Respiratory system: Clear to auscultation. Respiratory effort normal. Cardiovascular system: S1 & S2 heard, RRR. No JVD, murmurs, rubs, gallops or clicks. No pedal edema. Gastrointestinal system: Abdomen is nondistended, soft and nontender. No organomegaly or masses felt. Normal bowel sounds heard. Central nervous system: Alert and oriented. No focal neurological deficits. Extremities: Symmetric 5 x 5 power. Skin: No rashes, lesions or ulcers Psychiatry: Judgement and insight appear normal. Mood & affect appropriate.   DVT prophylaxis:SCDs Code Status:Full Family Communication:No family at bedside. One Ruthann Cancer and a sitter present. Disposition Plan:Pending improvement and clearing his bowel.Most likely go back to jail/inpatient psych.  Consultants:  Psych  Gastroenterology   Data Reviewed: I have personally reviewed following labs and imaging studies  CBC: No results for input(s): WBC, NEUTROABS, HGB, HCT, MCV, PLT in the last 168 hours. Basic Metabolic Panel: No results for input(s): NA, K, CL, CO2, GLUCOSE, BUN, CREATININE, CALCIUM, MG, PHOS in the last 168 hours. GFR: Estimated Creatinine Clearance: 131.3 mL/min (by C-G formula based on SCr of 0.7 mg/dL). Liver Function Tests: No results for input(s): AST, ALT, ALKPHOS, BILITOT, PROT, ALBUMIN in the last 168 hours. No  results for input(s): LIPASE, AMYLASE in the last 168 hours. No results for input(s): AMMONIA in the last 168 hours. Coagulation Profile: No results for input(s): INR, PROTIME  in the last 168 hours. Cardiac Enzymes: No results for input(s): CKTOTAL, CKMB, CKMBINDEX, TROPONINI in the last 168 hours. BNP (last 3 results) No results for input(s): PROBNP in the last 8760 hours. HbA1C: No results for input(s): HGBA1C in the last 72 hours. CBG: No results for input(s): GLUCAP in the last 168 hours. Lipid Profile: No results for input(s): CHOL, HDL, LDLCALC, TRIG, CHOLHDL, LDLDIRECT in the last 72 hours. Thyroid Function Tests: No results for input(s): TSH, T4TOTAL, FREET4, T3FREE, THYROIDAB in the last 72 hours. Anemia Panel: No results for input(s): VITAMINB12, FOLATE, FERRITIN, TIBC, IRON, RETICCTPCT in the last 72 hours. Sepsis Labs: No results for input(s): PROCALCITON, LATICACIDVEN in the last 168 hours.  Recent Results (from the past 240 hour(s))  SARS Coronavirus 2 by RT PCR (hospital order, performed in [facility] hospital lab) Nasopharyngeal Nasopharyngeal Swab     Status: None   Collection Time: 05/07/19  5:44 PM   Specimen: Nasopharyngeal Swab  Result Value Ref Range Status   SARS Coronavirus 2 NEGATIVE NEGATIVE Final    Comment: (NOTE) If result is NEGATIVE SARS-CoV-2 target nucleic acids are NOT DETECTED. The SARS-CoV-2 RNA is generally detectable in upper and lower  respiratory specimens during the acute phase of infection. The lowest  concentration of SARS-CoV-2 viral copies this assay can detect is 250  copies / mL. A negative result does not preclude SARS-CoV-2 infection  and should not be used as the sole basis for treatment or other  patient management decisions.  A negative result may occur with  improper specimen collection / handling, submission of specimen other  than nasopharyngeal swab, presence of viral mutation(s) within the  areas targeted by this assay, and inadequate number of viral copies  (<250 copies / mL). A negative result must be combined with clinical  observations, patient history, and epidemiological information. If  result is POSITIVE SARS-CoV-2 target nucleic acids are DETECTED. The SARS-CoV-2 RNA is generally detectable in upper and lower  respiratory specimens dur ing the acute phase of infection.  Positive  results are indicative of active infection with SARS-CoV-2.  Clinical  correlation with patient history and other diagnostic information is  necessary to determine patient infection status.  Positive results do  not rule out bacterial infection or co-infection with other viruses. If result is PRESUMPTIVE POSTIVE SARS-CoV-2 nucleic acids MAY BE PRESENT.   A presumptive positive result was obtained on the submitted specimen  and confirmed on repeat testing.  While 2019 novel coronavirus  (SARS-CoV-2) nucleic acids may be present in the submitted sample  additional confirmatory testing may be necessary for epidemiological  and / or clinical management purposes  to differentiate between  SARS-CoV-2 and other Sarbecovirus currently known to infect humans.  If clinically indicated additional testing with an alternate test  methodology [phone]([id number]) is advised. The SARS-CoV-2 RNA is generally  detectable in upper and lower respiratory sp ecimens during the acute  phase of infection. The expected result is Negative. Fact Sheet for Patients:  BoilerBrush.com.cyhttps://www.fda.gov/media/136312/download Fact Sheet for Healthcare Providers: https://pope.com/https://www.fda.gov/media/136313/download This test is not yet approved or cleared by the [place] FDA and has been authorized for detection and/or diagnosis of SARS-CoV-2 by FDA under an Emergency Use Authorization (EUA).  This EUA will remain in effect (meaning this test can be used) for the duration of the COVID-19  declaration under Section 564(b)(1) of the Act, 21 U.S.C. section 360bbb-3(b)(1), unless the authorization is terminated or revoked sooner. Performed at [facility], [address] [place], [place] [address]   MRSA PCR Screening     Status:  Abnormal   Collection Time: 05/07/19  8:27 PM   Specimen: Nasal Mucosa; Nasopharyngeal  Result Value Ref Range Status   MRSA by PCR POSITIVE (A) NEGATIVE Final    Comment:        The GeneXpert MRSA Assay (FDA approved for NASAL specimens only), is one component of a comprehensive MRSA colonization surveillance program. It is not intended to diagnose MRSA infection nor to guide or monitor treatment for MRSA infections. RESULT CALLED TO, READ BACK BY AND VERIFIED WITH: P,ALLEN AT 0157 ON 05/08/19 BY A,MOHAMED Performed at [facility], [address], [place], [place] [address]      Radiology Studies: Dg Abd 1 View  Result Date: 05/16/2019 CLINICAL DATA:  Foreign body ingestion EXAM: ABDOMEN - 1 VIEW COMPARISON:  Yesterday FINDINGS: High-density foreign bodies are seen scattered along the abdomen, mainly seen at the colon. These are seen from the ascending colon to the level of the rectum. Formed stool is seen throughout the colon. No evidence of bowel obstruction. No concerning mass effect or calcification. IMPRESSION: 1. Metallic foreign bodies distributed along the colon with mild progression since yesterday. The majority remain clustered at the ascending colon. 2. Decreased stool burden but still diffuse colonic stool. Electronically Signed   By: Marnee Spring M.D.   On: 05/16/2019 04:43   Dg Abd 2 Views  Result Date: 05/15/2019 CLINICAL DATA:  Foreign bodies, lower abdominal pain EXAM: ABDOMEN - 2 VIEW COMPARISON:  05/14/2019, 05/13/2019 FINDINGS: Nonobstructive pattern of bowel gas with gas present to the rectum. Multiple linear radiopaque foreign bodies appear to have progressed to the colon, one appears to be within the distal rectum and several within the distal colon. There is a cluster of foreign bodies which appear to be within the patulous transverse colon. No free air in the abdomen. IMPRESSION: 1. Nonobstructive pattern of bowel gas with gas present to  the rectum. 2. Multiple linear radiopaque foreign bodies appear to have progressed to the colon, one appears to be within the distal rectum and several within the distal colon. There is a cluster of foreign bodies which appear to be within the patulous transverse colon. Electronically Signed   By: Lauralyn Primes M.D.   On: 05/15/2019 16:28    Scheduled Meds: . docusate sodium  200 mg Oral BID  . linaclotide  145 mcg Oral QAC breakfast  . polyethylene glycol-electrolytes  4,000 mL Oral Once  . polyethylene glycol-electrolytes  4,000 mL Oral Once  . prazosin  2 mg Oral QHS  . sertraline  25 mg Oral Daily   Continuous Infusions:   LOS: 9 days   Time spent: 30 minutes.  Arnetha Courser, MD [facility] Pager [phone]  If 7PM-7AM, please contact night-coverage [email or web address] Password [facility] 05/16/2019, 10:23 AM   This record has been created using Dragon voice recognition software. Errors have been sought and corrected,but may not always be located. Such creation errors do not reflect on the standard of care.

## 2019-05-16 NOTE — Progress Notes (Signed)
Subjective: Patient has still not finished his gallon of bowel prep that was started on 05/11/2019. States he had 1 formed bowel movement yesterday and one formed bowel movement the day before, no bowel movement today. He states he has lower abdominal pain, has been able to tolerate regular diet.  Objective: Vital signs in last 24 hours: Temp:  [98 F (36.7 C)-99 F (37.2 C)] 98 F (36.7 C) (11/16 0424) Pulse Rate:  [70-79] 77 (11/16 0424) Resp:  [17-18] 18 (11/16 0424) BP: (123-132)/(73-83) 132/80 (11/16 0424) SpO2:  [97 %-99 %] 99 % (11/16 0424) Weight change:  Last BM Date: 05/15/19  PE: Appears comfortable, not in distress GENERAL: No pallor, no icterus ABDOMEN: Soft, mild lower abdominal tenderness, normal active bowel sounds EXTREMITIES: No deformity  Lab Results: No results found for this or any previous visit (from the past 48 hour(s)).  Studies/Results: Dg Abd 1 View  Result Date: 05/16/2019 CLINICAL DATA:  Foreign body ingestion EXAM: ABDOMEN - 1 VIEW COMPARISON:  Yesterday FINDINGS: High-density foreign bodies are seen scattered along the abdomen, mainly seen at the colon. These are seen from the ascending colon to the level of the rectum. Formed stool is seen throughout the colon. No evidence of bowel obstruction. No concerning mass effect or calcification. IMPRESSION: 1. Metallic foreign bodies distributed along the colon with mild progression since yesterday. The majority remain clustered at the ascending colon. 2. Decreased stool burden but still diffuse colonic stool. Electronically Signed   By: Monte Fantasia M.D.   On: 05/16/2019 04:43   Dg Abd 2 Views  Result Date: 05/15/2019 CLINICAL DATA:  Foreign bodies, lower abdominal pain EXAM: ABDOMEN - 2 VIEW COMPARISON:  05/14/2019, 05/13/2019 FINDINGS: Nonobstructive pattern of bowel gas with gas present to the rectum. Multiple linear radiopaque foreign bodies appear to have progressed to the colon, one appears to be  within the distal rectum and several within the distal colon. There is a cluster of foreign bodies which appear to be within the patulous transverse colon. No free air in the abdomen. IMPRESSION: 1. Nonobstructive pattern of bowel gas with gas present to the rectum. 2. Multiple linear radiopaque foreign bodies appear to have progressed to the colon, one appears to be within the distal rectum and several within the distal colon. There is a cluster of foreign bodies which appear to be within the patulous transverse colon. Electronically Signed   By: Eddie Candle M.D.   On: 05/15/2019 16:28    Medications: I have reviewed the patient's current medications.  Assessment: Metallic foreign bodies distributed along the colon with mild progression from yesterday on abdominal x-ray from today. Major remain clustered at the ascending colon with decreased stool burden but still diffuse colonic stool.  Plan: It appears that patient is not drinking the colonic prep as instructed, taking it very slow, although he is able to tolerate regular diet without any issues. Advised patient to finish his prep that was started on 05/11/2019, will put an order for another gallon of colonic prep to be started starting at noon, recommend abdominal x-ray in a.m.Marland Kitchen  Ronnette Juniper, MD 05/16/2019, 8:59 AM

## 2019-05-17 ENCOUNTER — Inpatient Hospital Stay (HOSPITAL_COMMUNITY)

## 2019-05-17 MED ORDER — HYDROMORPHONE HCL 1 MG/ML IJ SOLN
1.0000 mg | Freq: Once | INTRAMUSCULAR | Status: AC
  Administered 2019-05-17: 1 mg via INTRAVENOUS
  Filled 2019-05-17: qty 1

## 2019-05-17 NOTE — Progress Notes (Signed)
Called into room by tech. Patient feels like he is going to fall in floor due to pain. However he made it back into bed safely. Pain 10/10. Oxy & dilaudid given. Liquid BM. Pt feels razor blades are coming out. No blood in stool. On call notified for more pain meds.

## 2019-05-17 NOTE — Progress Notes (Signed)
PROGRESS NOTE    Christopher Stephenson  OZD:[id number] DOB: 28-Mar-1986 DOA: 05/07/2019 PCP: Patient, No Pcp Per   Brief Narrative:  33 y.o.malewith medical history significant ofhepatitis C was brought in by law enforcement for in tensional ingestion of razor blades. He did this to harm himself he has had multiple suicide attempts previously. He feels complaints of depression and he just wanted to kill himself. He has had hospital admissions for similar presentation. We are monitoring serial KUBs as it keeps showing more metal pieces then originalimaging. Patient continued to remain suicidal and psych recommended inpatient admission once medically clear. He will go back to [place] [facility] on discharge.  Subjective: No new complaint.  Continue to have abdominal pain.  He is having better bowel movements after drinking some colon prep.  Encouraged him to continue with that.  Assessment & Plan:   Active Problems:   Ingestion of foreign body   Intentional self-harm by razor blade (HCC)   Foreign body aspiration, sequela   Major depressive disorder, recurrent episode, moderate (HCC)  Razor blade ingestion with suicidal attempt.Serial KUB showingprogression of metal pieces in bowel.Patient is accompanied by a sitter and two Designer, television/film set in the room. Current pain regimen seems working. -GI is following-we appreciate their recommendations. -Agreeable to drink some more colon prep.  With the hope that he will flush it out.  If unable to flush it out, might need colonoscopy. -Continue good bowel regimen and serial KUB until it clears. -ContinueK pad. -Continue Dilaudid as needed for pain. -Continue OxyIR and Toradol for pain.  Depression with suicidal ideation.Psychiatry is following,recommending inpatient psych admit once medically stable. -Continue Zoloft and prazosin as recommended by psych.  Objective: Vitals:   05/16/19 0424 05/16/19 1500 05/16/19 2009 05/17/19 0502   BP: 132/80 124/78 137/86 122/73  Pulse: 77 76 84 82  Resp: 18 16 17 18   Temp: 98 F (36.7 C) 98 F (36.7 C) 98.5 F (36.9 C) 97.6 F (36.4 C)  TempSrc: Oral Oral Oral Oral  SpO2: 99% 97% 99% 99%  Weight:      Height:        Intake/Output Summary (Last 24 hours) at 05/17/2019 1411 Last data filed at 05/17/2019 1300 Gross per 24 hour  Intake 1080 ml  Output -  Net 1080 ml   Filed Weights   05/08/19 1900  Weight: 72.6 kg   Examination:  General exam: Appears calm and comfortable  Respiratory system: Clear to auscultation. Respiratory effort normal. Cardiovascular system: S1 & S2 heard, RRR. No JVD, murmurs, rubs, gallops or clicks. No pedal edema. Gastrointestinal system: Abdomen is nondistended, soft and nontender. No organomegaly or masses felt. Normal bowel sounds heard. Central nervous system: Alert and oriented. No focal neurological deficits. Extremities: Symmetric 5 x 5 power. Skin: No rashes, lesions or ulcers Psychiatry: Judgement and insight appear normal. Mood & affect appropriate.   DVT prophylaxis:SCDs Code Status:Full Family Communication:No family at bedside. One 13/08/20 and a sitter present. Disposition Plan:Pending improvement and clearing his bowel.Most likely go back to jail/inpatient psych.  Consultants:  Psych  Gastroenterology   Data Reviewed: I have personally reviewed following labs and imaging studies  CBC: No results for input(s): WBC, NEUTROABS, HGB, HCT, MCV, PLT in the last 168 hours. Basic Metabolic Panel: No results for input(s): NA, K, CL, CO2, GLUCOSE, BUN, CREATININE, CALCIUM, MG, PHOS in the last 168 hours. GFR: Estimated Creatinine Clearance: 131.3 mL/min (by C-G formula based on SCr of 0.7 mg/dL). Liver Function Tests: No results  for input(s): AST, ALT, ALKPHOS, BILITOT, PROT, ALBUMIN in the last 168 hours. No results for input(s): LIPASE, AMYLASE in the last 168 hours. No results for input(s): AMMONIA in the last  168 hours. Coagulation Profile: No results for input(s): INR, PROTIME in the last 168 hours. Cardiac Enzymes: No results for input(s): CKTOTAL, CKMB, CKMBINDEX, TROPONINI in the last 168 hours. BNP (last 3 results) No results for input(s): PROBNP in the last 8760 hours. HbA1C: No results for input(s): HGBA1C in the last 72 hours. CBG: No results for input(s): GLUCAP in the last 168 hours. Lipid Profile: No results for input(s): CHOL, HDL, LDLCALC, TRIG, CHOLHDL, LDLDIRECT in the last 72 hours. Thyroid Function Tests: No results for input(s): TSH, T4TOTAL, FREET4, T3FREE, THYROIDAB in the last 72 hours. Anemia Panel: No results for input(s): VITAMINB12, FOLATE, FERRITIN, TIBC, IRON, RETICCTPCT in the last 72 hours. Sepsis Labs: No results for input(s): PROCALCITON, LATICACIDVEN in the last 168 hours.  Recent Results (from the past 240 hour(s))  SARS Coronavirus 2 by RT PCR (hospital order, performed in [facility] hospital lab) Nasopharyngeal Nasopharyngeal Swab     Status: None   Collection Time: 05/07/19  5:44 PM   Specimen: Nasopharyngeal Swab  Result Value Ref Range Status   SARS Coronavirus 2 NEGATIVE NEGATIVE Final    Comment: (NOTE) If result is NEGATIVE SARS-CoV-2 target nucleic acids are NOT DETECTED. The SARS-CoV-2 RNA is generally detectable in upper and lower  respiratory specimens during the acute phase of infection. The lowest  concentration of SARS-CoV-2 viral copies this assay can detect is 250  copies / mL. A negative result does not preclude SARS-CoV-2 infection  and should not be used as the sole basis for treatment or other  patient management decisions.  A negative result may occur with  improper specimen collection / handling, submission of specimen other  than nasopharyngeal swab, presence of viral mutation(s) within the  areas targeted by this assay, and inadequate number of viral copies  (<250 copies / mL). A negative result must be combined with clinical   observations, patient history, and epidemiological information. If result is POSITIVE SARS-CoV-2 target nucleic acids are DETECTED. The SARS-CoV-2 RNA is generally detectable in upper and lower  respiratory specimens dur ing the acute phase of infection.  Positive  results are indicative of active infection with SARS-CoV-2.  Clinical  correlation with patient history and other diagnostic information is  necessary to determine patient infection status.  Positive results do  not rule out bacterial infection or co-infection with other viruses. If result is PRESUMPTIVE POSTIVE SARS-CoV-2 nucleic acids MAY BE PRESENT.   A presumptive positive result was obtained on the submitted specimen  and confirmed on repeat testing.  While 2019 novel coronavirus  (SARS-CoV-2) nucleic acids may be present in the submitted sample  additional confirmatory testing may be necessary for epidemiological  and / or clinical management purposes  to differentiate between  SARS-CoV-2 and other Sarbecovirus currently known to infect humans.  If clinically indicated additional testing with an alternate test  methodology [phone]) is advised. The SARS-CoV-2 RNA is generally  detectable in upper and lower respiratory sp ecimens during the acute  phase of infection. The expected result is Negative. Fact Sheet for Patients:  StrictlyIdeas.no Fact Sheet for Healthcare Providers: BankingDealers.co.za This test is not yet approved or cleared by the [place] FDA and has been authorized for detection and/or diagnosis of SARS-CoV-2 by FDA under an Emergency Use Authorization (EUA).  This EUA will remain  in effect (meaning this test can be used) for the duration of the COVID-19 declaration under Section 564(b)(1) of the Act, 21 U.S.C. section 360bbb-3(b)(1), unless the authorization is terminated or revoked sooner. Performed at [facility], [address] [place], [place] [address]   MRSA PCR Screening     Status: Abnormal   Collection Time: 05/07/19  8:27 PM   Specimen: Nasal Mucosa; Nasopharyngeal  Result Value Ref Range Status   MRSA by PCR POSITIVE (A) NEGATIVE Final    Comment:        The GeneXpert MRSA Assay (FDA approved for NASAL specimens only), is one component of a comprehensive MRSA colonization surveillance program. It is not intended to diagnose MRSA infection nor to guide or monitor treatment for MRSA infections. RESULT CALLED TO, READ BACK BY AND VERIFIED WITH: P,ALLEN AT 0157 ON 05/08/19 BY A,MOHAMED Performed at [facility], [address] [place], [place] [address]      Radiology Studies: Dg Abd 1 View  Result Date: 05/17/2019 CLINICAL DATA:  Progression of ingested foreign bodies. EXAM: ABDOMEN - 1 VIEW COMPARISON:  Most recent radiograph yesterday. FINDINGS: Nine linear foreign bodies persist in the abdomen. Single foreign body projects over the midline pelvis likely in the rectum. The other foreign bodies are within the right abdomen likely in the ascending colon, with a group of 3 and a group of 5. No evidence of perforation or free air. Decreased colonic stool burden. IMPRESSION: Nine linear foreign bodies persist in the abdomen. One in the midline pelvis in the region of the rectum. Remainder in the right abdomen likely in the ascending colon, with a group of 3 and group of 5. Electronically Signed   By: Narda RutherfordMelanie  Sanford M.D.   On: 05/17/2019 05:58   Dg Abd 1 View  Result Date: 05/16/2019 CLINICAL DATA:  Foreign body ingestion EXAM: ABDOMEN - 1 VIEW COMPARISON:  Yesterday FINDINGS: High-density foreign bodies are seen scattered along the abdomen, mainly seen at the colon. These are seen from the ascending colon to the level of the rectum. Formed stool is seen throughout the colon. No evidence of bowel obstruction. No concerning mass effect or calcification. IMPRESSION: 1. Metallic  foreign bodies distributed along the colon with mild progression since yesterday. The majority remain clustered at the ascending colon. 2. Decreased stool burden but still diffuse colonic stool. Electronically Signed   By: Marnee SpringJonathon  Watts M.D.   On: 05/16/2019 04:43    Scheduled Meds: . docusate sodium  200 mg Oral BID  . linaclotide  145 mcg Oral QAC breakfast  . polyethylene glycol-electrolytes  4,000 mL Oral Once  . prazosin  2 mg Oral QHS  . sertraline  25 mg Oral Daily   Continuous Infusions:   LOS: 10 days   Time spent: 30 minutes.  Arnetha CourserSumayya Khloey Chern, MD [facility] Pager [phone]  If 7PM-7AM, please contact night-coverage [email or web address] Password [email or web address] 05/17/2019, 2:11 PM   This record has been created using Conservation officer, historic buildingsDragon voice recognition software. Errors have been sought and corrected,but may not always be located. Such creation errors do not reflect on the standard of care.

## 2019-05-17 NOTE — Progress Notes (Signed)
Subjective: Patient has been drinking TriLyte colonic prep and reports having several bowel movements which are loose and watery.  He has not noticed any metallic object in his stool.  He is on regular diet and continues to complain of lower abdominal pain.  Objective: Vital signs in last 24 hours: Temp:  [97.6 F (36.4 C)-98.5 F (36.9 C)] 98.2 F (36.8 C) (11/17 1427) Pulse Rate:  [76-92] 92 (11/17 1427) Resp:  [16-18] 18 (11/17 1427) BP: (122-137)/(73-90) 123/90 (11/17 1427) SpO2:  [97 %-99 %] 97 % (11/17 1427) Weight change:  Last BM Date: 05/16/19  PE: Not in distress GENERAL: No pallor, no icterus ABDOMEN: Soft, lower abdominal tenderness EXTREMITIES: No deformity  Lab Results: No results found for this or any previous visit (from the past 48 hour(s)).  Studies/Results: Dg Abd 1 View  Result Date: 05/17/2019 CLINICAL DATA:  Progression of ingested foreign bodies. EXAM: ABDOMEN - 1 VIEW COMPARISON:  Most recent radiograph yesterday. FINDINGS: Nine linear foreign bodies persist in the abdomen. Single foreign body projects over the midline pelvis likely in the rectum. The other foreign bodies are within the right abdomen likely in the ascending colon, with a group of 3 and a group of 5. No evidence of perforation or free air. Decreased colonic stool burden. IMPRESSION: Nine linear foreign bodies persist in the abdomen. One in the midline pelvis in the region of the rectum. Remainder in the right abdomen likely in the ascending colon, with a group of 3 and group of 5. Electronically Signed   By: Keith Rake M.D.   On: 05/17/2019 05:58   Dg Abd 1 View  Result Date: 05/16/2019 CLINICAL DATA:  Foreign body ingestion EXAM: ABDOMEN - 1 VIEW COMPARISON:  Yesterday FINDINGS: High-density foreign bodies are seen scattered along the abdomen, mainly seen at the colon. These are seen from the ascending colon to the level of the rectum. Formed stool is seen throughout the colon. No  evidence of bowel obstruction. No concerning mass effect or calcification. IMPRESSION: 1. Metallic foreign bodies distributed along the colon with mild progression since yesterday. The majority remain clustered at the ascending colon. 2. Decreased stool burden but still diffuse colonic stool. Electronically Signed   By: Monte Fantasia M.D.   On: 05/16/2019 04:43    Medications: I have reviewed the patient's current medications.  Assessment: Continued presence of metallic foreign bodies distributed along the colon with mild progression from yesterday, majority remain clustered at ascending colon with decreased stool burden but still diffuse colonic stool  Plan: Continue to finish the second jar of colonic prep, repeat x-ray in a.m.Marland Kitchen  Ronnette Juniper, MD 05/17/2019, 2:46 PM

## 2019-05-18 ENCOUNTER — Inpatient Hospital Stay (HOSPITAL_COMMUNITY)

## 2019-05-18 MED ORDER — PRAZOSIN HCL 1 MG PO CAPS
4.0000 mg | ORAL_CAPSULE | Freq: Every day | ORAL | Status: DC
  Administered 2019-05-18 – 2019-05-21 (×4): 4 mg via ORAL
  Filled 2019-05-18 (×5): qty 4

## 2019-05-18 MED ORDER — PSYLLIUM 95 % PO PACK
1.0000 | PACK | Freq: Three times a day (TID) | ORAL | Status: DC
  Administered 2019-05-18 – 2019-05-22 (×11): 1 via ORAL
  Filled 2019-05-18 (×15): qty 1

## 2019-05-18 NOTE — Progress Notes (Signed)
PROGRESS NOTE    Christopher Stephenson  CBJ:[id number] DOB: 02-15-1986 DOA: 05/07/2019 PCP: Patient, No Pcp Per    Brief Narrative:32 y.o.malewith medical history significant ofhepatitis C was brought in by law enforcement for in tensional ingestion of razor blades. He did this to harm himself he has had multiple suicide attempts previously. He feels complaints of depression and he just wanted to kill himself. He has had hospital admissions for similar presentation. We are monitoring serial KUBs as it keeps showing more metal pieces then originalimaging. Patient continued to remain suicidal and psych recommended inpatient admission once medically clear. He will go back to [place] [facility] on discharge.   Assessment & Plan:   Active Problems:   Ingestion of foreign body   Intentional self-harm by razor blade ([place])   Foreign body aspiration, sequela   Major depressive disorder, recurrent episode, moderate ([place])   #1 razor blade ingestion in an attempt to suicide.  Patient has had multiple admissions for the same.  KUB shows multiple foreign bodies in the right side of the colon which was not there at the time of admission.  At the time of admission there were only 3 foreign bodies in the KUB.  Patient had colon prep now GI starting him on MiraLAX and continuing soft diet hoping that the foreign bodies will pass. Continue pain control OxyIR Toradol or Dilaudid.  #2 depression with suicidal ideation and PTSD continue Zoloft and prazosin.  I will increase the dose of prazosin to 4 mg daily as he is still complaining of nightmares.    Estimated body mass index is 23.63 kg/m as calculated from the following:   Height as of this encounter: 5\' 9"  (1.753 m).   Weight as of this encounter: 72.6 kg. DVT prophylaxis:SCDs Code Status:Full Family Communication:No family at bedside. 2 Ruthann Cancer and a sitter present. Disposition Plan:Pending improvement and clearing his bowel.Most likely go back  to jail/inpatient psych.  Consultants:  Psych  Gastroenterology   Subjective: Complains of abdominal pain having nightmares having loose bowel movements  Objective: Vitals:   05/17/19 0502 05/17/19 1427 05/17/19 1909 05/18/19 0543  BP: 122/73 123/90 110/72 122/69  Pulse: 82 92 86 85  Resp: 18 18 18 18   Temp: 97.6 F (36.4 C) 98.2 F (36.8 C) 98.4 F (36.9 C) 98.1 F (36.7 C)  TempSrc: Oral Oral    SpO2: 99% 97% 97% 98%  Weight:      Height:        Intake/Output Summary (Last 24 hours) at 05/18/2019 1445 Last data filed at 05/18/2019 1607 Gross per 24 hour  Intake 1080 ml  Output -  Net 1080 ml   Filed Weights   05/08/19 1900  Weight: 72.6 kg    Examination:  General exam: Appears calm and comfortable  Respiratory system: Clear to auscultation. Respiratory effort normal. Cardiovascular system: S1 & S2 heard, RRR. No JVD, murmurs, rubs, gallops or clicks. No pedal edema. Gastrointestinal system: Abdomen is nondistended, soft and nontender. No organomegaly or masses felt. Normal bowel sounds heard. Central nervous system: Alert and oriented. No focal neurological deficits. Extremities: Symmetric 5 x 5 power. Skin: No rashes, lesions or ulcers Psychiatry: Judgement and insight appear normal. Mood & affect appropriate.     Data Reviewed: I have personally reviewed following labs and imaging studies  CBC: No results for input(s): WBC, NEUTROABS, HGB, HCT, MCV, PLT in the last 168 hours. Basic Metabolic Panel: No results for input(s): NA, K, CL, CO2, GLUCOSE, BUN, CREATININE, CALCIUM, MG,  PHOS in the last 168 hours. GFR: Estimated Creatinine Clearance: 131.3 mL/min (by C-G formula based on SCr of 0.7 mg/dL). Liver Function Tests: No results for input(s): AST, ALT, ALKPHOS, BILITOT, PROT, ALBUMIN in the last 168 hours. No results for input(s): LIPASE, AMYLASE in the last 168 hours. No results for input(s): AMMONIA in the last 168 hours. Coagulation  Profile: No results for input(s): INR, PROTIME in the last 168 hours. Cardiac Enzymes: No results for input(s): CKTOTAL, CKMB, CKMBINDEX, TROPONINI in the last 168 hours. BNP (last 3 results) No results for input(s): PROBNP in the last 8760 hours. HbA1C: No results for input(s): HGBA1C in the last 72 hours. CBG: No results for input(s): GLUCAP in the last 168 hours. Lipid Profile: No results for input(s): CHOL, HDL, LDLCALC, TRIG, CHOLHDL, LDLDIRECT in the last 72 hours. Thyroid Function Tests: No results for input(s): TSH, T4TOTAL, FREET4, T3FREE, THYROIDAB in the last 72 hours. Anemia Panel: No results for input(s): VITAMINB12, FOLATE, FERRITIN, TIBC, IRON, RETICCTPCT in the last 72 hours. Sepsis Labs: No results for input(s): PROCALCITON, LATICACIDVEN in the last 168 hours.  No results found for this or any previous visit (from the past 240 hour(s)).       Radiology Studies: Dg Abd 1 View  Result Date: 05/18/2019 CLINICAL DATA:  Progression of foreign bodies. EXAM: ABDOMEN - 1 VIEW COMPARISON:  Radiograph yesterday. FINDINGS: Previous foreign body projecting over the pelvis presumably in the rectum is no longer seen. Eight residual linear foreign bodies in the right mid abdomen, likely in the ascending colon, not significantly progressed from prior. Air-filled nondilated colon. Slight increased small bowel gas without evidence of obstruction. No evidence for free air on this supine view. IMPRESSION: 1. Eight residual linear foreign bodies in the right mid abdomen, likely in the ascending colon, not significantly progressed since yesterday. Previous foreign body in the low pelvis/rectum has passed. 2. Slight increased small bowel gas since yesterday without obstruction. Electronically Signed   By: Narda Rutherford M.D.   On: 05/18/2019 05:46   Dg Abd 1 View  Result Date: 05/17/2019 CLINICAL DATA:  Progression of ingested foreign bodies. EXAM: ABDOMEN - 1 VIEW COMPARISON:  Most  recent radiograph yesterday. FINDINGS: Nine linear foreign bodies persist in the abdomen. Single foreign body projects over the midline pelvis likely in the rectum. The other foreign bodies are within the right abdomen likely in the ascending colon, with a group of 3 and a group of 5. No evidence of perforation or free air. Decreased colonic stool burden. IMPRESSION: Nine linear foreign bodies persist in the abdomen. One in the midline pelvis in the region of the rectum. Remainder in the right abdomen likely in the ascending colon, with a group of 3 and group of 5. Electronically Signed   By: Narda Rutherford M.D.   On: 05/17/2019 05:58        Scheduled Meds: . docusate sodium  200 mg Oral BID  . linaclotide  145 mcg Oral QAC breakfast  . polyethylene glycol-electrolytes  4,000 mL Oral Once  . prazosin  4 mg Oral QHS  . psyllium  1 packet Oral TID  . sertraline  25 mg Oral Daily   Continuous Infusions:   LOS: 11 days     Alwyn Ren, MD [facility]  If 7PM-7AM, please contact night-coverage [email or web address] Password [email or web address] 05/18/2019, 2:45 PM

## 2019-05-18 NOTE — Progress Notes (Signed)
Subjective: Patient finished his second gallon of TriLyte yesterday and reports multiple liquid stools.  He continues to have lower abdominal pain.  Objective: Vital signs in last 24 hours: Temp:  [98.1 F (36.7 C)-98.4 F (36.9 C)] 98.1 F (36.7 C) (11/18 0543) Pulse Rate:  [85-92] 85 (11/18 0543) Resp:  [18] 18 (11/18 0543) BP: (110-123)/(69-90) 122/69 (11/18 0543) SpO2:  [97 %-98 %] 98 % (11/18 0543) Weight change:  Last BM Date: 05/18/19  PE: Lying on bed, appears comfortable GENERAL: No pallor, no icterus ABDOMEN: Soft, nondistended, lower abdominal tenderness  EXTREMITIES: No deformity  Lab Results: No results found for this or any previous visit (from the past 48 hour(s)).  Studies/Results: Dg Abd 1 View  Result Date: 05/18/2019 CLINICAL DATA:  Progression of foreign bodies. EXAM: ABDOMEN - 1 VIEW COMPARISON:  Radiograph yesterday. FINDINGS: Previous foreign body projecting over the pelvis presumably in the rectum is no longer seen. Eight residual linear foreign bodies in the right mid abdomen, likely in the ascending colon, not significantly progressed from prior. Air-filled nondilated colon. Slight increased small bowel gas without evidence of obstruction. No evidence for free air on this supine view. IMPRESSION: 1. Eight residual linear foreign bodies in the right mid abdomen, likely in the ascending colon, not significantly progressed since yesterday. Previous foreign body in the low pelvis/rectum has passed. 2. Slight increased small bowel gas since yesterday without obstruction. Electronically Signed   By: Keith Rake M.D.   On: 05/18/2019 05:46   Dg Abd 1 View  Result Date: 05/17/2019 CLINICAL DATA:  Progression of ingested foreign bodies. EXAM: ABDOMEN - 1 VIEW COMPARISON:  Most recent radiograph yesterday. FINDINGS: Nine linear foreign bodies persist in the abdomen. Single foreign body projects over the midline pelvis likely in the rectum. The other foreign  bodies are within the right abdomen likely in the ascending colon, with a group of 3 and a group of 5. No evidence of perforation or free air. Decreased colonic stool burden. IMPRESSION: Nine linear foreign bodies persist in the abdomen. One in the midline pelvis in the region of the rectum. Remainder in the right abdomen likely in the ascending colon, with a group of 3 and group of 5. Electronically Signed   By: Keith Rake M.D.   On: 05/17/2019 05:58    Medications: I have reviewed the patient's current medications.  Assessment: 8 residual linear foreign bodies in right mid abdomen likely in ascending colon Previous foreign body in lower pelvis/rectum has passed Right increased small bowel gas since yesterday without obstruction  Plan: We will start patient on Metamucil p.o. 3 times daily and continue soft diet.    Ronnette Juniper, MD 05/18/2019, 8:37 AM

## 2019-05-19 ENCOUNTER — Inpatient Hospital Stay (HOSPITAL_COMMUNITY)

## 2019-05-19 NOTE — Plan of Care (Signed)

## 2019-05-19 NOTE — Progress Notes (Addendum)
PROGRESS NOTE    Christopher Stephenson  JIR:[id number] DOB: 08-29-1985 DOA: 05/07/2019 PCP: Patient, No Pcp Per    Brief Narrative:32 y.o.malewith medical history significant ofhepatitis C was brought in by law enforcement for in tensional ingestion of razor blades. He did this to harm himself he has had multiple suicide attempts previously. He feels complaints of depression and he just wanted to kill himself. He has had hospital admissions for similar presentation. We are monitoring serial KUBs as it keeps showing more metal pieces then originalimaging. Patient continued to remain suicidal and psych recommended inpatient admission once medically clear. He will go back to [place] jail on discharge.   Assessment & Plan:   Active Problems:   Ingestion of foreign body   Intentional self-harm by razor blade (HCC)   Foreign body aspiration, sequela   Major depressive disorder, recurrent episode, moderate (HCC)  #1 razor blade ingestion in an attempt to suicide.  Patient has had multiple admissions for the same.  KUB shows multiple foreign bodies in the right side of the colon which was not there at the time of admission.  At the time of admission there were only 3 foreign bodies in the KUB.  Patient on Colace 200 mg twice a day with linzess and Metamucil. Follow-up KUB in a.m. Discussed with GI Continue pain control OxyIR Toradol or Dilaudid.  #2 depression with suicidal ideation and PTSD continue Zoloft and prazosin.  I will increase the dose of prazosin to 4 mg daily as he is still complaining of nightmares.     Estimated body mass index is 23.63 kg/m as calculated from the following:   Height as of this encounter: 5\' 9"  (1.753 m).   Weight as of this encounter: 72.6 kg. DVT prophylaxis:SCDs Code Status:Full Family Communication:No family at bedside. 2 and a sitter present. Disposition Plan:Pending improvement and clearing his bowel.Most likely go back to  jail/inpatient psych.  Consultants:  Psych  Gastroenterology   Subjective:  No nausea vomiting having multiple loose BMs but none today on soft diet Objective: Vitals:   05/18/19 0543 05/18/19 2124 05/19/19 0617 05/19/19 1451  BP: 122/69 (!) 138/94 112/68 106/62  Pulse: 85 86 84 69  Resp: 18 20 20 17   Temp: 98.1 F (36.7 C) 98.1 F (36.7 C) 97.7 F (36.5 C) 98.1 F (36.7 C)  TempSrc:  Oral Oral Oral  SpO2: 98% 98% 99% 98%  Weight:      Height:        Intake/Output Summary (Last 24 hours) at 05/19/2019 1455 Last data filed at 05/19/2019 0800 Gross per 24 hour  Intake 840 ml  Output -  Net 840 ml   Filed Weights   05/08/19 1900  Weight: 72.6 kg    Examination:  General exam: Appears calm and comfortable  Respiratory system: Clear to auscultation. Respiratory effort normal. Cardiovascular system: S1 & S2 heard, RRR. No JVD, murmurs, rubs, gallops or clicks. No pedal edema. Gastrointestinal system: Abdomen is nondistended, soft and mild generalized tender. No organomegaly or masses felt. Normal bowel sounds heard. Central nervous system: Alert and oriented. No focal neurological deficits. Extremities: Symmetric 5 x 5 power. Skin: No rashes, lesions or ulcers Psychiatry: Judgement and insight appear normal. Mood & affect appropriate.     Data Reviewed: I have personally reviewed following labs and imaging studies  CBC: No results for input(s): WBC, NEUTROABS, HGB, HCT, MCV, PLT in the last 168 hours. Basic Metabolic Panel: No results for input(s): NA, K, CL, CO2, GLUCOSE,  BUN, CREATININE, CALCIUM, MG, PHOS in the last 168 hours. GFR: Estimated Creatinine Clearance: 131.3 mL/min (by C-G formula based on SCr of 0.7 mg/dL). Liver Function Tests: No results for input(s): AST, ALT, ALKPHOS, BILITOT, PROT, ALBUMIN in the last 168 hours. No results for input(s): LIPASE, AMYLASE in the last 168 hours. No results for input(s): AMMONIA in the last 168 hours.  Coagulation Profile: No results for input(s): INR, PROTIME in the last 168 hours. Cardiac Enzymes: No results for input(s): CKTOTAL, CKMB, CKMBINDEX, TROPONINI in the last 168 hours. BNP (last 3 results) No results for input(s): PROBNP in the last 8760 hours. HbA1C: No results for input(s): HGBA1C in the last 72 hours. CBG: No results for input(s): GLUCAP in the last 168 hours. Lipid Profile: No results for input(s): CHOL, HDL, LDLCALC, TRIG, CHOLHDL, LDLDIRECT in the last 72 hours. Thyroid Function Tests: No results for input(s): TSH, T4TOTAL, FREET4, T3FREE, THYROIDAB in the last 72 hours. Anemia Panel: No results for input(s): VITAMINB12, FOLATE, FERRITIN, TIBC, IRON, RETICCTPCT in the last 72 hours. Sepsis Labs: No results for input(s): PROCALCITON, LATICACIDVEN in the last 168 hours.  No results found for this or any previous visit (from the past 240 hour(s)).       Radiology Studies: Dg Abd 1 View  Result Date: 05/19/2019 CLINICAL DATA:  Progression of foreign bodies. EXAM: ABDOMEN - 1 VIEW COMPARISON:  May 18, 2019. FINDINGS: No abnormal bowel dilatation is noted. 2 curvilinear metallic densities are seen in the region of the splenic flexure. There remains approximately 7 linear metallic densities in the expected position of the right colon. IMPRESSION: Multiple curvilinear foreign bodies are again noted in expected position of the colon, with 2 such densities seen in the region of the splenic flexure. Electronically Signed   By: Marijo Conception M.D.   On: 05/19/2019 07:04   Dg Abd 1 View  Result Date: 05/18/2019 CLINICAL DATA:  Progression of foreign bodies. EXAM: ABDOMEN - 1 VIEW COMPARISON:  Radiograph yesterday. FINDINGS: Previous foreign body projecting over the pelvis presumably in the rectum is no longer seen. Eight residual linear foreign bodies in the right mid abdomen, likely in the ascending colon, not significantly progressed from prior. Air-filled  nondilated colon. Slight increased small bowel gas without evidence of obstruction. No evidence for free air on this supine view. IMPRESSION: 1. Eight residual linear foreign bodies in the right mid abdomen, likely in the ascending colon, not significantly progressed since yesterday. Previous foreign body in the low pelvis/rectum has passed. 2. Slight increased small bowel gas since yesterday without obstruction. Electronically Signed   By: Keith Rake M.D.   On: 05/18/2019 05:46        Scheduled Meds: . docusate sodium  200 mg Oral BID  . linaclotide  145 mcg Oral QAC breakfast  . polyethylene glycol-electrolytes  4,000 mL Oral Once  . prazosin  4 mg Oral QHS  . psyllium  1 packet Oral TID  . sertraline  25 mg Oral Daily   Continuous Infusions:   LOS: 12 days     Georgette Shell, MD [facility]  If 7PM-7AM, please contact night-coverage [email or web address] Password [email or web address] 05/19/2019, 2:55 PM

## 2019-05-19 NOTE — Progress Notes (Signed)
Subjective: Continues to have several loose bowel movements, no bowel movements today. Complains of lower back pain.  Objective: Vital signs in last 24 hours: Temp:  [97.7 F (36.5 C)-98.1 F (36.7 C)] 97.7 F (36.5 C) (11/19 0617) Pulse Rate:  [84-86] 84 (11/19 0617) Resp:  [20] 20 (11/19 0617) BP: (112-138)/(68-94) 112/68 (11/19 0617) SpO2:  [98 %-99 %] 99 % (11/19 0617) Weight change:  Last BM Date: 05/18/19  PE: Not in distress, appears comfortable GENERAL: Lying on bed ABDOMEN: Soft, mild lower abdominal tenderness, normoactive bowel sounds EXTREMITIES: No deformity  Lab Results: No results found for this or any previous visit (from the past 48 hour(s)).  Studies/Results: Dg Abd 1 View  Result Date: 05/19/2019 CLINICAL DATA:  Progression of foreign bodies. EXAM: ABDOMEN - 1 VIEW COMPARISON:  May 18, 2019. FINDINGS: No abnormal bowel dilatation is noted. 2 curvilinear metallic densities are seen in the region of the splenic flexure. There remains approximately 7 linear metallic densities in the expected position of the right colon. IMPRESSION: Multiple curvilinear foreign bodies are again noted in expected position of the colon, with 2 such densities seen in the region of the splenic flexure. Electronically Signed   By: Marijo Conception M.D.   On: 05/19/2019 07:04   Dg Abd 1 View  Result Date: 05/18/2019 CLINICAL DATA:  Progression of foreign bodies. EXAM: ABDOMEN - 1 VIEW COMPARISON:  Radiograph yesterday. FINDINGS: Previous foreign body projecting over the pelvis presumably in the rectum is no longer seen. Eight residual linear foreign bodies in the right mid abdomen, likely in the ascending colon, not significantly progressed from prior. Air-filled nondilated colon. Slight increased small bowel gas without evidence of obstruction. No evidence for free air on this supine view. IMPRESSION: 1. Eight residual linear foreign bodies in the right mid abdomen, likely in the  ascending colon, not significantly progressed since yesterday. Previous foreign body in the low pelvis/rectum has passed. 2. Slight increased small bowel gas since yesterday without obstruction. Electronically Signed   By: Keith Rake M.D.   On: 05/18/2019 05:46    Medications: I have reviewed the patient's current medications.  Assessment: 7 residual linear foreign bodies likely in ascending colon  2 curvilinear densities noted in the region of splenic flexure  Plan: Already on regular diet, Colace 200 mg twice a day, Linzess 145 mcg daily, psyllium 1 packet 3 times a day. Continue conservative management, no plans for colonscopy as patient ingested razor blades and remains at high risk for perforation.  Ronnette Juniper, MD 05/19/2019, 8:54 AM

## 2019-05-20 ENCOUNTER — Inpatient Hospital Stay (HOSPITAL_COMMUNITY)

## 2019-05-20 NOTE — Progress Notes (Addendum)
Subjective: Patient states he had a bowel movement today morning which was more formed, thereafter he has severe right-sided abdominal pain and is requesting for ice pack.  Objective: Vital signs in last 24 hours: Temp:  [97.9 F (36.6 C)-98.3 F (36.8 C)] 97.9 F (36.6 C) (11/20 0512) Pulse Rate:  [69-88] 88 (11/20 0512) Resp:  [16-17] 16 (11/20 0512) BP: (106-135)/(62-83) 135/83 (11/20 0512) SpO2:  [98 %] 98 % (11/20 0512) Weight change:  Last BM Date: 05/19/19  PE: In mild distress from pain GENERAL: Sitting up on bed ABDOMEN: Voluntary guarding, normal active bowel sounds, mild lower abdominal tenderness EXTREMITIES: no deformity  Lab Results: No results found for this or any previous visit (from the past 48 hour(s)).  Studies/Results: Dg Abd 1 View  Result Date: 05/20/2019 CLINICAL DATA:  Foreign body ingestion, progression EXAM: ABDOMEN - 1 VIEW COMPARISON:  Portable exam 0446 hours compared to 05/19/2019 FINDINGS: Multiple linear metallic foreign bodies are again identified projecting over the abdomen, 8 in number, projecting over proximal transverse through descending colon. Nonobstructive bowel gas pattern. No bowel dilatation or bowel wall thickening. Osseous structures unremarkable. IMPRESSION: Multiple metallic foreign bodies are again identified projecting over colon as above. Electronically Signed   By: Lavonia Dana M.D.   On: 05/20/2019 08:03   Dg Abd 1 View  Result Date: 05/19/2019 CLINICAL DATA:  Progression of foreign bodies. EXAM: ABDOMEN - 1 VIEW COMPARISON:  May 18, 2019. FINDINGS: No abnormal bowel dilatation is noted. 2 curvilinear metallic densities are seen in the region of the splenic flexure. There remains approximately 7 linear metallic densities in the expected position of the right colon. IMPRESSION: Multiple curvilinear foreign bodies are again noted in expected position of the colon, with 2 such densities seen in the region of the splenic flexure.  Electronically Signed   By: Marijo Conception M.D.   On: 05/19/2019 07:04    Medications: I have reviewed the patient's current medications.  Assessment: Multiple linear metallic foreign bodies identified over abdomen, 8 in number, projecting over proximal transverse to descending colon with nonobstructive bowel gas pattern, no bowel dilatation or bowel wall thickening  Plan: Patient with history of ingestion of razor blades, is at increased risk of perforation, able to tolerate Metamucil 1 packet 3 times a day colace 200 mg BID. Ruthann Cancer present at bedside is requesting for primary physician to see if patient can be allowed to walk/ambulate, to see if this can expedite progression of foreign object into the distal colon and ultimate expulsion.  Ronnette Juniper, MD 05/20/2019, 8:37 AM

## 2019-05-20 NOTE — Progress Notes (Signed)
PROGRESS NOTE    Christopher Stephenson  JME:[id number] DOB: 1986-06-10 DOA: 05/07/2019 PCP: Patient, No Pcp Per   Brief Narrative: 33 y.o.malewith medical history significant ofhepatitis C was brought in by law enforcement for in tensional ingestion of razor blades. He did this to harm himself he has had multiple suicide attempts previously. He feels complaints of depression and he just wanted to kill himself. He has had hospital admissions for similar presentation. We are monitoring serial KUBs as it keeps showing more metal pieces then originalimaging. Patient continued to remain suicidal and psych recommended inpatient admission once medically clear. He will go back to [place] [facility] on discharge.    Assessment & Plan:   Active Problems:   Ingestion of foreign body   Intentional self-harm by razor blade (HCC)   Foreign body aspiration, sequela   Major depressive disorder, recurrent episode, moderate (HCC)     #1 foreign body ingestion KUB today shows 7 of the foreign body in the left descending colon with 1  on the right ascending colon.  Continue Metamucil and Colace.  Daily KUB.  #2 depression with suicidal ideation and PTSD on Zoloft.  Prazosin increased 2 days ago.  Estimated body mass index is 23.63 kg/m as calculated from the following:   Height as of this encounter: 5\' 9"  (1.753 m).   Weight as of this encounter: 72.6 kg.   Subjective: Had bowel movement this morning which was formed does not know if he passed any foreign body.  Complaining of right lower quadrant abdominal pain since he had a bowel movement.  Objective: Vitals:   05/19/19 0617 05/19/19 1451 05/19/19 2115 05/20/19 0512  BP: 112/68 106/62 121/68 135/83  Pulse: 84 69 78 88  Resp: 20 17 16 16   Temp: 97.7 F (36.5 C) 98.1 F (36.7 C) 98.3 F (36.8 C) 97.9 F (36.6 C)  TempSrc: Oral Oral Oral Oral  SpO2: 99% 98% 98% 98%  Weight:      Height:        Intake/Output Summary (Last 24 hours) at  05/20/2019 1237 Last data filed at 05/20/2019 1224 Gross per 24 hour  Intake 720 ml  Output -  Net 720 ml   Filed Weights   05/08/19 1900  Weight: 72.6 kg    Examination:  General exam: Appears calm and comfortable  Respiratory system: Clear to auscultation. Respiratory effort normal. Cardiovascular system: S1 & S2 heard, RRR. No JVD, murmurs, rubs, gallops or clicks. No pedal edema. Gastrointestinal system: Abdomen is nondistended, soft and minimally tender right lower quadrant with guarding . No organomegaly or masses felt. Normal bowel sounds heard. Central nervous system: Alert and oriented. No focal neurological deficits. Extremities: Symmetric 5 x 5 power. Skin: No rashes, lesions or ulcers Psychiatry: Judgement and insight appear normal. Mood & affect appropriate.     Data Reviewed: I have personally reviewed following labs and imaging studies  CBC: No results for input(s): WBC, NEUTROABS, HGB, HCT, MCV, PLT in the last 168 hours. Basic Metabolic Panel: No results for input(s): NA, K, CL, CO2, GLUCOSE, BUN, CREATININE, CALCIUM, MG, PHOS in the last 168 hours. GFR: Estimated Creatinine Clearance: 131.3 mL/min (by C-G formula based on SCr of 0.7 mg/dL). Liver Function Tests: No results for input(s): AST, ALT, ALKPHOS, BILITOT, PROT, ALBUMIN in the last 168 hours. No results for input(s): LIPASE, AMYLASE in the last 168 hours. No results for input(s): AMMONIA in the last 168 hours. Coagulation Profile: No results for input(s): INR, PROTIME in the last  168 hours. Cardiac Enzymes: No results for input(s): CKTOTAL, CKMB, CKMBINDEX, TROPONINI in the last 168 hours. BNP (last 3 results) No results for input(s): PROBNP in the last 8760 hours. HbA1C: No results for input(s): HGBA1C in the last 72 hours. CBG: No results for input(s): GLUCAP in the last 168 hours. Lipid Profile: No results for input(s): CHOL, HDL, LDLCALC, TRIG, CHOLHDL, LDLDIRECT in the last 72 hours.  Thyroid Function Tests: No results for input(s): TSH, T4TOTAL, FREET4, T3FREE, THYROIDAB in the last 72 hours. Anemia Panel: No results for input(s): VITAMINB12, FOLATE, FERRITIN, TIBC, IRON, RETICCTPCT in the last 72 hours. Sepsis Labs: No results for input(s): PROCALCITON, LATICACIDVEN in the last 168 hours.  No results found for this or any previous visit (from the past 240 hour(s)).       Radiology Studies: Dg Abd 1 View  Result Date: 05/20/2019 CLINICAL DATA:  Foreign body ingestion, progression EXAM: ABDOMEN - 1 VIEW COMPARISON:  Portable exam 0446 hours compared to 05/19/2019 FINDINGS: Multiple linear metallic foreign bodies are again identified projecting over the abdomen, 8 in number, projecting over proximal transverse through descending colon. Nonobstructive bowel gas pattern. No bowel dilatation or bowel wall thickening. Osseous structures unremarkable. IMPRESSION: Multiple metallic foreign bodies are again identified projecting over colon as above. Electronically Signed   By: Lavonia Dana M.D.   On: 05/20/2019 08:03   Dg Abd 1 View  Result Date: 05/19/2019 CLINICAL DATA:  Progression of foreign bodies. EXAM: ABDOMEN - 1 VIEW COMPARISON:  May 18, 2019. FINDINGS: No abnormal bowel dilatation is noted. 2 curvilinear metallic densities are seen in the region of the splenic flexure. There remains approximately 7 linear metallic densities in the expected position of the right colon. IMPRESSION: Multiple curvilinear foreign bodies are again noted in expected position of the colon, with 2 such densities seen in the region of the splenic flexure. Electronically Signed   By: Marijo Conception M.D.   On: 05/19/2019 07:04        Scheduled Meds: . docusate sodium  200 mg Oral BID  . prazosin  4 mg Oral QHS  . psyllium  1 packet Oral TID  . sertraline  25 mg Oral Daily   Continuous Infusions:   LOS: 13 days     Georgette Shell, MD [facility]  If 7PM-7AM,  please contact night-coverage [email or web address] Password [facility] 05/20/2019, 12:37 PM

## 2019-05-21 ENCOUNTER — Inpatient Hospital Stay (HOSPITAL_COMMUNITY)

## 2019-05-21 LAB — CBC
HCT: 48.4 % (ref 39.0–52.0)
Hemoglobin: 15.8 g/dL (ref 13.0–17.0)
MCH: 30 pg (ref 26.0–34.0)
MCHC: 32.6 g/dL (ref 30.0–36.0)
MCV: 91.8 fL (ref 80.0–100.0)
Platelets: 214 10*3/uL (ref 150–400)
RBC: 5.27 MIL/uL (ref 4.22–5.81)
RDW: 12.6 % (ref 11.5–15.5)
WBC: 6 10*3/uL (ref 4.0–10.5)
nRBC: 0 % (ref 0.0–0.2)

## 2019-05-21 LAB — COMPREHENSIVE METABOLIC PANEL
ALT: 15 U/L (ref 0–44)
AST: 13 U/L — ABNORMAL LOW (ref 15–41)
Albumin: 4.1 g/dL (ref 3.5–5.0)
Alkaline Phosphatase: 53 U/L (ref 38–126)
Anion gap: 7 (ref 5–15)
BUN: 15 mg/dL (ref 6–20)
CO2: 28 mmol/L (ref 22–32)
Calcium: 8.9 mg/dL (ref 8.9–10.3)
Chloride: 102 mmol/L (ref 98–111)
Creatinine, Ser: 0.88 mg/dL (ref 0.61–1.24)
GFR calc Af Amer: >60 mL/min (ref >60)
GFR calc non Af Amer: >60 mL/min (ref >60)
Glucose, Bld: 99 mg/dL (ref 70–99)
Potassium: 4.1 mmol/L (ref 3.5–5.1)
Sodium: 137 mmol/L (ref 135–145)
Total Bilirubin: 1.2 mg/dL (ref 0.3–1.2)
Total Protein: 6.6 g/dL (ref 6.5–8.1)

## 2019-05-21 NOTE — Progress Notes (Signed)
PROGRESS NOTE    Christopher Stephenson  NKN:[id number] DOB: May 04, 1986 DOA: 05/07/2019 PCP: Patient, No Pcp Per   Brief Narrative:32 y.o.malewith medical history significant ofhepatitis C was brought in by law enforcement for in tensional ingestion of razor blades. He did this to harm himself he has had multiple suicide attempts previously. He feels complaints of depression and he just wanted to kill himself. He has had hospital admissions for similar presentation. We are monitoring serial KUBs as it keeps showing more metal pieces then originalimaging. Patient continued to remain suicidal and psych recommended inpatient admission once medically clear. He will go back to [place] [facility] on discharge.   Assessment & Plan:   Active Problems:   Ingestion of foreign body   Intentional self-harm by razor blade (HCC)   Foreign body aspiration, sequela   Major depressive disorder, recurrent episode, moderate (HCC)   #1 foreign body ingestion KUB today shows  progression of the foreign bodies towards see sigmoid area.  KUB reviewed by me.  However he still has 1 foreign body in the splenic flexure . Continue Metamucil and Colace.  Daily KUB.  #2 depression with suicidal ideation and PTSD on Zoloft.  Prazosin increased 2 days ago.    Estimated body mass index is 23.63 kg/m as calculated from the following:   Height as of this encounter: 5\' 9"  (1.753 m).   Weight as of this encounter: 72.6 kg.  DVT prophylaxis:SCDs Code Status:Full Family Communication:No family at bedside. and a sitter present. Disposition Plan:Pending improvement and clearing his bowel.Most likely go back to jail/inpatient psych.  Consultants:  Psych  Gastroenterology  Subjective:  No bm today no nausea vomiting, he reports abdominal pain in the suprapubic area.    Objective: Vitals:   05/20/19 0512 05/20/19 1359 05/20/19 2144 05/21/19 0457  BP: 135/83 134/73 138/87 120/72  Pulse: 88 82  68 67  Resp: 16 16 17 16   Temp: 97.9 F (36.6 C) 98.5 F (36.9 C) 98.8 F (37.1 C) 97.8 F (36.6 C)  TempSrc: Oral Oral Oral Oral  SpO2: 98% 97% 98% 97%  Weight:      Height:        Intake/Output Summary (Last 24 hours) at 05/21/2019 1346 Last data filed at 05/21/2019 05/23/2019 Gross per 24 hour  Intake 720 ml  Output -  Net 720 ml   Filed Weights   05/08/19 1900  Weight: 72.6 kg    Examination:  General exam: Appears calm and comfortable  Respiratory system: Clear to auscultation. Respiratory effort normal. Cardiovascular system: S1 & S2 heard, RRR. No JVD, murmurs, rubs, gallops or clicks. No pedal edema. Gastrointestinal system: Abdomen is nondistended, soft and nontender. No organomegaly or masses felt. Normal bowel sounds heard. Central nervous system: Alert and oriented. No focal neurological deficits. Extremities: Symmetric 5 x 5 power. Skin: No rashes, lesions or ulcers Psychiatry: Judgement and insight appear normal. Mood & affect appropriate.     Data Reviewed: I have personally reviewed following labs and imaging studies  CBC: Recent Labs  Lab 05/21/19 0616  WBC 6.0  HGB 15.8  HCT 48.4  MCV 91.8  PLT 214   Basic Metabolic Panel: Recent Labs  Lab 05/21/19 0616  NA 137  K 4.1  CL 102  CO2 28  GLUCOSE 99  BUN 15  CREATININE 0.88  CALCIUM 8.9   GFR: Estimated Creatinine Clearance: 119.4 mL/min (by C-G formula based on SCr of 0.88 mg/dL). Liver Function Tests: Recent Labs  Lab 05/21/19 [phone]  AST 13*  ALT 15  ALKPHOS 53  BILITOT 1.2  PROT 6.6  ALBUMIN 4.1   No results for input(s): LIPASE, AMYLASE in the last 168 hours. No results for input(s): AMMONIA in the last 168 hours. Coagulation Profile: No results for input(s): INR, PROTIME in the last 168 hours. Cardiac Enzymes: No results for input(s): CKTOTAL, CKMB, CKMBINDEX, TROPONINI in the last 168 hours. BNP (last 3 results) No results for input(s): PROBNP in the last 8760 hours.  HbA1C: No results for input(s): HGBA1C in the last 72 hours. CBG: No results for input(s): GLUCAP in the last 168 hours. Lipid Profile: No results for input(s): CHOL, HDL, LDLCALC, TRIG, CHOLHDL, LDLDIRECT in the last 72 hours. Thyroid Function Tests: No results for input(s): TSH, T4TOTAL, FREET4, T3FREE, THYROIDAB in the last 72 hours. Anemia Panel: No results for input(s): VITAMINB12, FOLATE, FERRITIN, TIBC, IRON, RETICCTPCT in the last 72 hours. Sepsis Labs: No results for input(s): PROCALCITON, LATICACIDVEN in the last 168 hours.  No results found for this or any previous visit (from the past 240 hour(s)).       Radiology Studies: Dg Abd 1 View  Result Date: 05/21/2019 CLINICAL DATA:  Swallowed foreign body. EXAM: ABDOMEN - 1 VIEW COMPARISON:  Radiograph yesterday. FINDINGS: Seven radiopaque foreign bodies project over the pelvis in the region of the sigmoid colon and rectum. Single foreign body projects over the splenic flexure of the colon. No small bowel dilatation or obstruction. No evidence of free air. IMPRESSION: Majority of foreign bodies are seen within the pelvis in the sigmoid colon and rectum, with a single foreign body in the splenic flexure of the colon. Electronically Signed   By: Keith Rake M.D.   On: 05/21/2019 06:48   Dg Abd 1 View  Result Date: 05/20/2019 CLINICAL DATA:  Foreign body ingestion, progression EXAM: ABDOMEN - 1 VIEW COMPARISON:  Portable exam 0446 hours compared to 05/19/2019 FINDINGS: Multiple linear metallic foreign bodies are again identified projecting over the abdomen, 8 in number, projecting over proximal transverse through descending colon. Nonobstructive bowel gas pattern. No bowel dilatation or bowel wall thickening. Osseous structures unremarkable. IMPRESSION: Multiple metallic foreign bodies are again identified projecting over colon as above. Electronically Signed   By: Lavonia Dana M.D.   On: 05/20/2019 08:03        Scheduled  Meds: . docusate sodium  200 mg Oral BID  . prazosin  4 mg Oral QHS  . psyllium  1 packet Oral TID  . sertraline  25 mg Oral Daily   Continuous Infusions:   LOS: 14 days     Georgette Shell, MD [facility]  If 7PM-7AM, please contact night-coverage [email or web address] Password [email or web address] 05/21/2019, 1:46 PM

## 2019-05-21 NOTE — Progress Notes (Signed)
Subjective: States he had a formed bowel movement, brown in color without associated blood, he needs to have 6 out of 10 abdominal pain.  Objective: Vital signs in last 24 hours: Temp:  [97.8 F (36.6 C)-98.8 F (37.1 C)] 98 F (36.7 C) (11/21 1418) Pulse Rate:  [67-82] 82 (11/21 1418) Resp:  [16-20] 20 (11/21 1418) BP: (120-138)/(72-87) 131/73 (11/21 1418) SpO2:  [97 %-100 %] 100 % (11/21 1418) Weight change:  Last BM Date: 05/20/19  PE: Not in distress GENERAL: Lying comfortably on bed ABDOMEN: Mild lower abdominal tenderness, normoactive bowel sounds EXTREMITIES: No deformity  Lab Results: Results for orders placed or performed during the hospital encounter of 05/07/19 (from the past 48 hour(s))  CBC     Status: None   Collection Time: 05/21/19  6:16 AM  Result Value Ref Range   WBC 6.0 4.0 - 10.5 K/uL   RBC 5.27 4.22 - 5.81 MIL/uL   Hemoglobin 15.8 13.0 - 17.0 g/dL   HCT 48.4 39.0 - 52.0 %   MCV 91.8 80.0 - 100.0 fL   MCH 30.0 26.0 - 34.0 pg   MCHC 32.6 30.0 - 36.0 g/dL   RDW 12.6 11.5 - 15.5 %   Platelets 214 150 - 400 K/uL   nRBC 0.0 0.0 - 0.2 %    Comment: Performed at [facility], [place] [address], [place], [place] [address]  Comprehensive metabolic panel     Status: Abnormal   Collection Time: 05/21/19  6:16 AM  Result Value Ref Range   Sodium 137 135 - 145 mmol/L   Potassium 4.1 3.5 - 5.1 mmol/L   Chloride 102 98 - 111 mmol/L   CO2 28 22 - 32 mmol/L   Glucose, Bld 99 70 - 99 mg/dL   BUN 15 6 - 20 mg/dL   Creatinine, Ser 0.88 0.61 - 1.24 mg/dL   Calcium 8.9 8.9 - 10.3 mg/dL   Total Protein 6.6 6.5 - 8.1 g/dL   Albumin 4.1 3.5 - 5.0 g/dL   AST 13 (L) 15 - 41 U/L   ALT 15 0 - 44 U/L   Alkaline Phosphatase 53 38 - 126 U/L   Total Bilirubin 1.2 0.3 - 1.2 mg/dL   GFR calc non Af Amer >60 >60 mL/min   GFR calc Af Amer >60 >60 mL/min   Anion gap 7 5 - 15    Comment: Performed at [facility], [place] [address],  [place], [place] [address]    Studies/Results: Dg Abd 1 View  Result Date: 05/21/2019 CLINICAL DATA:  Swallowed foreign body. EXAM: ABDOMEN - 1 VIEW COMPARISON:  Radiograph yesterday. FINDINGS: Seven radiopaque foreign bodies project over the pelvis in the region of the sigmoid colon and rectum. Single foreign body projects over the splenic flexure of the colon. No small bowel dilatation or obstruction. No evidence of free air. IMPRESSION: Majority of foreign bodies are seen within the pelvis in the sigmoid colon and rectum, with a single foreign body in the splenic flexure of the colon. Electronically Signed   By: Keith Rake M.D.   On: 05/21/2019 06:48   Dg Abd 1 View  Result Date: 05/20/2019 CLINICAL DATA:  Foreign body ingestion, progression EXAM: ABDOMEN - 1 VIEW COMPARISON:  Portable exam 0446 hours compared to 05/19/2019 FINDINGS: Multiple linear metallic foreign bodies are again identified projecting over the abdomen, 8 in number, projecting over proximal transverse through descending colon. Nonobstructive bowel gas pattern. No bowel dilatation or bowel wall thickening.  Osseous structures unremarkable. IMPRESSION: Multiple metallic foreign bodies are again identified projecting over colon as above. Electronically Signed   By: Ulyses Southward M.D.   On: 05/20/2019 08:03    Medications: I have reviewed the patient's current medications.  Assessment: Majority of foreign bodies are seen within pelvis and the sigmoid colon and rectum with a single foreign body and splenic flexure of colon   Plan: Foreign bodies likely ingested razor blades seem to be moving distally. Continue soft diet with Colace 200 mg twice a day and Metamucil 1 packet 3 times a day. Follow-up x-ray in a.m.Marland Kitchen  Kerin Salen, MD 05/21/2019, 2:59 PM

## 2019-05-22 ENCOUNTER — Inpatient Hospital Stay (HOSPITAL_COMMUNITY)

## 2019-05-22 MED ORDER — DOCUSATE SODIUM 100 MG PO CAPS: 200.0000 mg | ORAL_CAPSULE | Freq: Two times a day (BID) | ORAL | 0 refills | Status: DC

## 2019-05-22 MED ORDER — PRAZOSIN HCL 2 MG PO CAPS: 4.0000 mg | ORAL_CAPSULE | Freq: Every day | ORAL | 1 refills | Status: AC

## 2019-05-22 MED ORDER — PSYLLIUM 95 % PO PACK: 1.0000 | PACK | Freq: Three times a day (TID) | ORAL | Status: DC

## 2019-05-22 MED ORDER — HYDROMORPHONE HCL 1 MG/ML IJ SOLN
1.0000 mg | Freq: Once | INTRAMUSCULAR | Status: AC
  Administered 2019-05-22: 1 mg via INTRAVENOUS

## 2019-05-22 MED ORDER — SERTRALINE HCL 25 MG PO TABS: 25.0000 mg | ORAL_TABLET | Freq: Every day | ORAL | 2 refills | Status: DC

## 2019-05-22 NOTE — Discharge Summary (Signed)
Physician Discharge Summary  Christopher Stephenson XBJ:[id number] DOB: May 02, 1986 DOA: 05/07/2019  PCP: Patient, No Pcp Per  Admit date: 05/07/2019 Discharge date: 05/22/2019  Admitted From: Jail  disposition: Jail  Recommendations for Outpatient Follow-up:  1. Follow up with jail MD  2. Follow-up with psychiatrist at the facility  Home Health: None Equipment/Devices: None  discharge Condition: Stable and improved  CODE STATUS full code  diet recommendation: Cardiac  brief/Interim Summary:32 y.o.malewith medical history significant ofhepatitis C was brought in by law enforcement for in tensional ingestion of razor blades. He did this to harm himself he has had multiple suicide attempts previously. He feels complaints of depression and he just wanted to kill himself. He has had hospital admissions for similar presentation. We are monitoring serial KUBs as it keeps showing more metal pieces then originalimaging. Patient continued to remain suicidal and psych recommended inpatient admission once medically clear. He will go back to [facility] on discharge.   Discharge Diagnoses:  Active Problems:   Ingestion of foreign body   Intentional self-harm by razor blade (HCC)   Foreign body aspiration, sequela   Major depressive disorder, recurrent episode, moderate (HCC)   #1 foreign body ingestion-at the time of admission he had multiple foreign bodies that was dispersed throughout his bowels and on the day of discharge he has excreted all the foreign bodies but 1.  He still has 1 foreign body near the sigmoid rectal area.  That should be passed with ongoing use of Colace and MiraLAX.  #2 depression/PTSD/suicidal ideation.  Patient was admitted here with one-to-one sitter and to [facility] with him at all times.  He was seen by psych recommended Zoloft and Minipress for PTSD.  He will have to follow-up with a psychiatrist at the jail.  [facility] tells me that he needs to go back to the jail.   They also told me that they have psych services in the jail .  Estimated body mass index is 23.63 kg/m as calculated from the following:   Height as of this encounter: 5\' 9"  (1.753 m).   Weight as of this encounter: 72.6 kg.  Discharge Instructions  Discharge Instructions    Diet - low sodium heart healthy   Complete by: As directed    Increase activity slowly   Complete by: As directed      Allergies as of 05/22/2019      Reactions   Penicillins Other (See Comments)   Unsure Did it involve swelling of the face/tongue/throat, SOB, or low BP? unknown Did it involve sudden or severe rash/hives, skin peeling, or any reaction on the inside of your mouth or nose? unknown Did you need to seek medical attention at a hospital or doctor's office? unknown When did it last happen?childhood allergy If all above answers are "NO", may proceed with cephalosporin use.      Medication List    TAKE these medications   docusate sodium 100 MG capsule Commonly known as: COLACE Take 2 capsules (200 mg total) by mouth 2 (two) times daily.   prazosin 2 MG capsule Commonly known as: MINIPRESS Take 2 capsules (4 mg total) by mouth at bedtime.   psyllium 95 % Pack Commonly known as: HYDROCIL/METAMUCIL Take 1 packet by mouth 3 (three) times daily.   sertraline 25 MG tablet Commonly known as: Zoloft Take 1 tablet (25 mg total) by mouth daily.       Allergies  Allergen Reactions  . Penicillins Other (See Comments)    Unsure Did  it involve swelling of the face/tongue/throat, SOB, or low BP? unknown Did it involve sudden or severe rash/hives, skin peeling, or any reaction on the inside of your mouth or nose? unknown Did you need to seek medical attention at a hospital or doctor's office? unknown When did it last happen?childhood allergy If all above answers are "NO", may proceed with cephalosporin use.     Consultations: Psychiatry and GI  Procedures/Studies: Dg Chest 2  View  Result Date: 05/07/2019 CLINICAL DATA:  Patient swallowed a razor blade of approximately 4 pieces today at approximately 12 or 1 p.m. EXAM: CHEST - 2 VIEW COMPARISON:  None. FINDINGS: The heart size and mediastinal contours are within normal limits. The lungs are clear. No pneumothorax or pleural effusion. No radiopaque foreign body in the chest. The visualized skeletal structures are unremarkable. IMPRESSION: No active cardiopulmonary disease. No radiopaque foreign body in the chest. Electronically Signed   By: Emmaline Kluver M.D.   On: 05/07/2019 15:44   Dg Abd 1 View  Result Date: 05/22/2019 CLINICAL DATA:  Ingestion of foreign bodies. EXAM: ABDOMEN - 1 VIEW COMPARISON:  Radiograph yesterday. FINDINGS: One residual foreign body projects over the left lower quadrant likely in the sigmoid colon. The remainder the foreign body soft cleared. No bowel dilatation or obstruction. No evidence of free air. Moderate stool burden. IMPRESSION: Single residual foreign body projects over the left lower quadrant, likely in the sigmoid colon. Remainder of the foreign bodies have cleared. Electronically Signed   By: Narda Rutherford M.D.   On: 05/22/2019 06:16   Dg Abd 1 View  Result Date: 05/21/2019 CLINICAL DATA:  Swallowed foreign body. EXAM: ABDOMEN - 1 VIEW COMPARISON:  Radiograph yesterday. FINDINGS: Seven radiopaque foreign bodies project over the pelvis in the region of the sigmoid colon and rectum. Single foreign body projects over the splenic flexure of the colon. No small bowel dilatation or obstruction. No evidence of free air. IMPRESSION: Majority of foreign bodies are seen within the pelvis in the sigmoid colon and rectum, with a single foreign body in the splenic flexure of the colon. Electronically Signed   By: Narda Rutherford M.D.   On: 05/21/2019 06:48   Dg Abd 1 View  Result Date: 05/20/2019 CLINICAL DATA:  Foreign body ingestion, progression EXAM: ABDOMEN - 1 VIEW COMPARISON:   Portable exam 0446 hours compared to 05/19/2019 FINDINGS: Multiple linear metallic foreign bodies are again identified projecting over the abdomen, 8 in number, projecting over proximal transverse through descending colon. Nonobstructive bowel gas pattern. No bowel dilatation or bowel wall thickening. Osseous structures unremarkable. IMPRESSION: Multiple metallic foreign bodies are again identified projecting over colon as above. Electronically Signed   By: Ulyses Southward M.D.   On: 05/20/2019 08:03   Dg Abd 1 View  Result Date: 05/19/2019 CLINICAL DATA:  Progression of foreign bodies. EXAM: ABDOMEN - 1 VIEW COMPARISON:  May 18, 2019. FINDINGS: No abnormal bowel dilatation is noted. 2 curvilinear metallic densities are seen in the region of the splenic flexure. There remains approximately 7 linear metallic densities in the expected position of the right colon. IMPRESSION: Multiple curvilinear foreign bodies are again noted in expected position of the colon, with 2 such densities seen in the region of the splenic flexure. Electronically Signed   By: Lupita Raider M.D.   On: 05/19/2019 07:04   Dg Abd 1 View  Result Date: 05/18/2019 CLINICAL DATA:  Progression of foreign bodies. EXAM: ABDOMEN - 1 VIEW COMPARISON:  Radiograph yesterday. FINDINGS:  Previous foreign body projecting over the pelvis presumably in the rectum is no longer seen. Eight residual linear foreign bodies in the right mid abdomen, likely in the ascending colon, not significantly progressed from prior. Air-filled nondilated colon. Slight increased small bowel gas without evidence of obstruction. No evidence for free air on this supine view. IMPRESSION: 1. Eight residual linear foreign bodies in the right mid abdomen, likely in the ascending colon, not significantly progressed since yesterday. Previous foreign body in the low pelvis/rectum has passed. 2. Slight increased small bowel gas since yesterday without obstruction. Electronically  Signed   By: Narda Rutherford M.D.   On: 05/18/2019 05:46   Dg Abd 1 View  Result Date: 05/17/2019 CLINICAL DATA:  Progression of ingested foreign bodies. EXAM: ABDOMEN - 1 VIEW COMPARISON:  Most recent radiograph yesterday. FINDINGS: Nine linear foreign bodies persist in the abdomen. Single foreign body projects over the midline pelvis likely in the rectum. The other foreign bodies are within the right abdomen likely in the ascending colon, with a group of 3 and a group of 5. No evidence of perforation or free air. Decreased colonic stool burden. IMPRESSION: Nine linear foreign bodies persist in the abdomen. One in the midline pelvis in the region of the rectum. Remainder in the right abdomen likely in the ascending colon, with a group of 3 and group of 5. Electronically Signed   By: Narda Rutherford M.D.   On: 05/17/2019 05:58   Dg Abd 1 View  Result Date: 05/16/2019 CLINICAL DATA:  Foreign body ingestion EXAM: ABDOMEN - 1 VIEW COMPARISON:  Yesterday FINDINGS: High-density foreign bodies are seen scattered along the abdomen, mainly seen at the colon. These are seen from the ascending colon to the level of the rectum. Formed stool is seen throughout the colon. No evidence of bowel obstruction. No concerning mass effect or calcification. IMPRESSION: 1. Metallic foreign bodies distributed along the colon with mild progression since yesterday. The majority remain clustered at the ascending colon. 2. Decreased stool burden but still diffuse colonic stool. Electronically Signed   By: Marnee Spring M.D.   On: 05/16/2019 04:43   Dg Abd 1 View  Result Date: 05/11/2019 CLINICAL DATA:  Ingestion of radiopaque foreign body. EXAM: ABDOMEN - 1 VIEW COMPARISON:  05/10/2019; 05/09/2019 FINDINGS: Interval increase in number of linear radiopaque foreign bodies, previously 5 radiopaque foreign bodies on the 05/10/2019, 3 were identified on the 05/09/2019 examination, while currently there are 9 radiopaque foreign  bodies are identified the majority of which appear to be located within the cecum and ascending colon. Dominant radiopaque foreign body measures approximately 2.4 x 0.2 cm. Nonobstructive bowel gas pattern. Nondiagnostic evaluation for pneumoperitoneum secondary to supine positioning and exclusion of the lower thorax. No pneumatosis or portal venous gas. No acute osseous abnormalities. Note is made of a small right-sided os acetabuli. IMPRESSION: Interval increase in number of radiopaque foreign bodies, all of which overlie the expected location the cecum and ascending colon. Findings suggestive of continued foreign body ingestion. Electronically Signed   By: Simonne Come M.D.   On: 05/11/2019 07:58   Dg Abd 1 View  Result Date: 05/08/2019 CLINICAL DATA:  Patient swallowed razor blade fragments EXAM: ABDOMEN - 1 VIEW COMPARISON:  05/07/2019 FINDINGS: There are 3 metallic foreign bodies in the right mid abdomen which may be within the ascending colon. There is no bowel dilatation to suggest obstruction. There is no evidence of pneumoperitoneum, portal venous gas or pneumatosis. There are no pathologic calcifications  along the expected course of the ureters. The osseous structures are unremarkable. IMPRESSION: 3 metallic foreign bodies in the right mid abdomen which may be within the ascending colon. Electronically Signed   By: Elige Ko   On: 05/08/2019 14:30   Dg Abd 1 View  Result Date: 05/07/2019 CLINICAL DATA:  Swallowed razor blades, 4 pieces EXAM: ABDOMEN - 1 VIEW COMPARISON:  May 07, 2019 3:29 p.m. FINDINGS: Again noted are 3 metallic foreign bodies within the mid abdomen measuring approximately 1.7 cm each. 1 now projects over the left mid abdomen likely within bowel loops. Two are seen just to the right of the L5 vertebral body also likely within bowel. No fourth foreign body is identified. The colon is air-filled. No acute osseous abnormality. IMPRESSION: Three metallic foreign bodies  consistent with the patient's history of ingestion as described above,, 1 within the left mid abdomen and 2 just to the right of the L5 vertebral body. Electronically Signed   By: Jonna Clark M.D.   On: 05/07/2019 19:34   Dg Abdomen 1 View  Result Date: 05/07/2019 CLINICAL DATA:  Patient swallowed a razor blade of approximately 4 pieces today at approximately 12 or 1 p.m. EXAM: ABDOMEN - 1 VIEW COMPARISON:  None. FINDINGS: There are 3 linear metallic densities in the abdomen, 1 of which projects over the stomach and the other 2 in the left lower quadrant, likely within bowel which most likely represent the ingested contents. There are no dilated loops of bowel. No evidence of free air. No acute finding in the visualized skeleton. IMPRESSION: 3 linear metallic densities in the abdomen, 1 of which projects over the stomach and the other 2 in the left lower quadrant, likely within bowel. These most likely represent the ingested razor blade pieces. No evidence of free air. Electronically Signed   By: Emmaline Kluver M.D.   On: 05/07/2019 15:47   Dg Abd 2 Views  Result Date: 05/15/2019 CLINICAL DATA:  Foreign bodies, lower abdominal pain EXAM: ABDOMEN - 2 VIEW COMPARISON:  05/14/2019, 05/13/2019 FINDINGS: Nonobstructive pattern of bowel gas with gas present to the rectum. Multiple linear radiopaque foreign bodies appear to have progressed to the colon, one appears to be within the distal rectum and several within the distal colon. There is a cluster of foreign bodies which appear to be within the patulous transverse colon. No free air in the abdomen. IMPRESSION: 1. Nonobstructive pattern of bowel gas with gas present to the rectum. 2. Multiple linear radiopaque foreign bodies appear to have progressed to the colon, one appears to be within the distal rectum and several within the distal colon. There is a cluster of foreign bodies which appear to be within the patulous transverse colon. Electronically Signed    By: Lauralyn Primes M.D.   On: 05/15/2019 16:28   Dg Abd 2 Views  Result Date: 05/12/2019 CLINICAL DATA:  33 year old male with follow-up of foreign body in the digestive system. EXAM: ABDOMEN - 2 VIEW COMPARISON:  Radiograph dated 05/11/2019. FINDINGS: Multiple linear radiopaque foreign object noted over the abdomen some of which are over the expected location of the cecum and ascending colon and two of which have likely progressed to the distal transverse colon. Several other radiopaque foreign object overlie the pelvis, likely in the region of the rectosigmoid. No definite free air identified. There is no evidence of bowel obstruction. The osseous structures and soft tissues are grossly unremarkable. IMPRESSION: 1. Multiple linear radiopaque foreign objects, several of which have  progressed within the bowel and likely located in the distal transverse colon and in the rectosigmoid. 2. No evidence of bowel perforation or obstruction. Electronically Signed   By: Anner Crete M.D.   On: 05/12/2019 10:42   Dg Abd 2 Views  Result Date: 05/10/2019 CLINICAL DATA:  Foreign body ingestion. EXAM: ABDOMEN - 2 VIEW COMPARISON:  May 09, 2019. FINDINGS: The bowel gas pattern is normal. There is no evidence of free air. Three linear metallic densities are again noted, 1 on the right side into in the left upper quadrant. These most likely represent ingested foreign bodies within the colon. Also noted are 2 other metallic curvilinear densities overlying the left upper quadrant which were not present on prior exam; is uncertain if these represent ingested foreign bodies or overlying artifact. IMPRESSION: Three linear metallic densities noted on prior exam are again seen, 1 appears to be in the right colon with the other 2 at the splenic flexure and descending colon. Two new curvilinear metallic densities are seen in the left upper quadrant; it is uncertain if these represent external objects or ingested foreign  bodies. Electronically Signed   By: Marijo Conception M.D.   On: 05/10/2019 09:57   Dg Abd 2 Views  Result Date: 05/09/2019 CLINICAL DATA:  Follow-up of ingestion of razor fragments. EXAM: ABDOMEN - 2 VIEW COMPARISON:  Radiographs dated 05/07/2019 and 05/08/2019 FINDINGS: There are 3 metallic foreign bodies in the abdomen. These appear to be in the ascending and transverse portions of the colon,, 1 in the mid ascending colon, 1 near the hepatic flexure and 1 near the splenic flexure. Bowel gas pattern is normal. No free air. Bones are normal. IMPRESSION: 3 metallic foreign bodies in the abdomen as described. The foreign bodies have moved into the colon. Electronically Signed   By: Lorriane Shire M.D.   On: 05/09/2019 10:11   Dg Abd Portable 2v  Result Date: 05/14/2019 CLINICAL DATA:  Foreign body ingestion. EXAM: PORTABLE ABDOMEN - 2 VIEW COMPARISON:  05/13/2019. FINDINGS: Antegrade progression of ingested radiopaque foreign bodies. There is now a foreign body within the hepatic flexure and mid descending colon. Remaining foreign bodies are noted at the hepatic flexure and mid transverse colon. No abnormal bowel dilatation. IMPRESSION: 1. Findings compatible with progression of ingested radiopaque foreign bodies. 2. Nonobstructive bowel gas pattern. Electronically Signed   By: Kerby Moors M.D.   On: 05/14/2019 05:31   Dg Abd Portable 2v  Result Date: 05/13/2019 CLINICAL DATA:  Foreign body in digestive system. EXAM: PORTABLE ABDOMEN - 2 VIEW COMPARISON:  05/12/2019 FINDINGS: Supine and left -side up decubitus views. The supine view demonstrates multiple radiopaque foreign objects, now projecting over the right-side of the abdomen. This suggests these are in the ascending colon today and were in distal small bowel loops on the prior. A single radiopaque object projects over the left upper quadrant, likely in the splenic flexure. The pelvis is excluded from the supine view. No bowel obstruction or gross  free intraperitoneal air. No significant air-fluid levels on decubitus imaging. IMPRESSION: Radiopaque foreign objects, felt to be within the colon, suggesting progression since yesterday's exam. No evidence of free intraperitoneal air. Electronically Signed   By: Abigail Miyamoto M.D.   On: 05/13/2019 07:00    (Echo, Carotid, EGD, Colonoscopy, ERCP)    Subjective: Awake alert anxious to go back to jail does have some suprapubic abdominal discomfort and pain but better than yesterday. Discharge Exam: Vitals:   05/21/19 2130 05/22/19  0430  BP: (!) 141/80 120/74  Pulse: 75 75  Resp: 18 18  Temp: 97.6 F (36.4 C) (!) 97.5 F (36.4 C)  SpO2: 98% 99%   Vitals:   05/21/19 0457 05/21/19 1418 05/21/19 2130 05/22/19 0430  BP: 120/72 131/73 (!) 141/80 120/74  Pulse: 67 82 75 75  Resp: 16 20 18 18   Temp: 97.8 F (36.6 C) 98 F (36.7 C) 97.6 F (36.4 C) (!) 97.5 F (36.4 C)  TempSrc: Oral  Oral Oral  SpO2: 97% 100% 98% 99%  Weight:      Height:        General: Pt is alert, awake, not in acute distress Cardiovascular: RRR, S1/S2 +, no rubs, no gallops Respiratory: CTA bilaterally, no wheezing, no rhonchi Abdominal: Soft, NT, ND, bowel sounds + Extremities: no edema, no cyanosis    The results of significant diagnostics from this hospitalization (including imaging, microbiology, ancillary and laboratory) are listed below for reference.     Microbiology: No results found for this or any previous visit (from the past 240 hour(s)).   Labs: BNP (last 3 results) No results for input(s): BNP in the last 8760 hours. Basic Metabolic Panel: Recent Labs  Lab 05/21/19 0616  NA 137  K 4.1  CL 102  CO2 28  GLUCOSE 99  BUN 15  CREATININE 0.88  CALCIUM 8.9   Liver Function Tests: Recent Labs  Lab 05/21/19 0616  AST 13*  ALT 15  ALKPHOS 53  BILITOT 1.2  PROT 6.6  ALBUMIN 4.1   No results for input(s): LIPASE, AMYLASE in the last 168 hours. No results for input(s): AMMONIA  in the last 168 hours. CBC: Recent Labs  Lab 05/21/19 0616  WBC 6.0  HGB 15.8  HCT 48.4  MCV 91.8  PLT 214   Cardiac Enzymes: No results for input(s): CKTOTAL, CKMB, CKMBINDEX, TROPONINI in the last 168 hours. BNP: Invalid input(s): POCBNP CBG: No results for input(s): GLUCAP in the last 168 hours. D-Dimer No results for input(s): DDIMER in the last 72 hours. Hgb A1c No results for input(s): HGBA1C in the last 72 hours. Lipid Profile No results for input(s): CHOL, HDL, LDLCALC, TRIG, CHOLHDL, LDLDIRECT in the last 72 hours. Thyroid function studies No results for input(s): TSH, T4TOTAL, T3FREE, THYROIDAB in the last 72 hours.  Invalid input(s): FREET3 Anemia work up No results for input(s): VITAMINB12, FOLATE, FERRITIN, TIBC, IRON, RETICCTPCT in the last 72 hours. Urinalysis No results found for: COLORURINE, APPEARANCEUR, LABSPEC, PHURINE, GLUCOSEU, HGBUR, BILIRUBINUR, KETONESUR, PROTEINUR, UROBILINOGEN, NITRITE, LEUKOCYTESUR Sepsis Labs Invalid input(s): PROCALCITONIN,  WBC,  LACTICIDVEN Microbiology No results found for this or any previous visit (from the past 240 hour(s)).   Time coordinating discharge:  39 minutes  SIGNED:   05/23/19, MD  [facility] 05/22/2019, 10:52 AM Pager   If 7PM-7AM, please contact night-coverage [email or web address] Password [email or web address]

## 2019-05-22 NOTE — TOC Transition Note (Signed)
Transition of Care [facility]) - CM/SW Discharge Note   Patient Details  Name: Caleb Decock MRN: [id number] Date of Birth: 05/19/1986  Transition of Care [facility]) CM/SW Contact:  Wende Neighbors, LCSW Phone Number: 05/22/2019, 12:53 PM   Clinical Narrative:   [facility] back to jail. Discharge summary and AVS given to [employer]. [employer] stated they had no other  questions         Barriers to Discharge: No Barriers Identified   Patient Goals and CMS Choice        Discharge Placement                       Discharge Plan and Services                                     Social Determinants of Health (SDOH) Interventions     Readmission Risk Interventions No flowsheet data found.

## 2019-05-22 NOTE — Progress Notes (Signed)
Nsg Discharge Note  Admit Date:  05/07/2019 Discharge date: 05/22/2019   [facility] to be D/C'd correctional facility per MD order.  AVS completed.  Copy for chart, and copy for patient signed, and dated. Patient/caregiver able to verbalize understanding.  Discharge Medication: Allergies as of 05/22/2019      Reactions   Penicillins Other (See Comments)   Unsure Did it involve swelling of the face/tongue/throat, SOB, or low BP? unknown Did it involve sudden or severe rash/hives, skin peeling, or any reaction on the inside of your mouth or nose? unknown Did you need to seek medical attention at a hospital or doctor's office? unknown When did it last happen?childhood allergy If all above answers are "NO", may proceed with cephalosporin use.      Medication List    TAKE these medications   docusate sodium 100 MG capsule Commonly known as: COLACE Take 2 capsules (200 mg total) by mouth 2 (two) times daily.   prazosin 2 MG capsule Commonly known as: MINIPRESS Take 2 capsules (4 mg total) by mouth at bedtime.   psyllium 95 % Pack Commonly known as: HYDROCIL/METAMUCIL Take 1 packet by mouth 3 (three) times daily.   sertraline 25 MG tablet Commonly known as: Zoloft Take 1 tablet (25 mg total) by mouth daily.       Discharge Assessment: Vitals:   05/21/19 2130 05/22/19 0430  BP: (!) 141/80 120/74  Pulse: 75 75  Resp: 18 18  Temp: 97.6 F (36.4 C) (!) 97.5 F (36.4 C)  SpO2: 98% 99%   Skin clean, dry and intact without evidence of skin break down, no evidence of skin tears noted. IV catheter discontinued intact. Site without signs and symptoms of complications - no redness or edema noted at insertion site, patient denies c/o pain - only slight tenderness at site.  Dressing with slight pressure applied.  D/c Instructions-Education: Discharge instructions given to patient/family with verbalized understanding. D/c education completed with patient/family including  follow up instructions, medication list, d/c activities limitations if indicated, with other d/c instructions as indicated by MD - patient able to verbalize understanding, all questions fully answered. Patient instructed to return to ED, call 911, or call MD for any changes in condition.  Patient escorted via [place], and D/C to correctional facility accompanied by [employer].   Eustace Pen, LPN 68/05/7516 00:17 PM

## 2019-05-22 NOTE — Progress Notes (Signed)
Subjective: Patient has not had a bowel movement today.  Continues to complain of lower abdominal pain although he is able to tolerate regular diet.  Objective: Vital signs in last 24 hours: Temp:  [97.5 F (36.4 C)-98 F (36.7 C)] 97.5 F (36.4 C) (11/22 0430) Pulse Rate:  [75-82] 75 (11/22 0430) Resp:  [18-20] 18 (11/22 0430) BP: (120-141)/(73-80) 120/74 (11/22 0430) SpO2:  [98 %-100 %] 99 % (11/22 0430) Weight change:  Last BM Date: 05/21/19  PE: Not in distress GENERAL: Lying on bed, no pallor, no icterus  ABDOMEN: Soft, voluntary guarding, mild lower abdominal tenderness, normoactive bowel sounds EXTREMITIES: No deformity  Lab Results: Results for orders placed or performed during the hospital encounter of 05/07/19 (from the past 48 hour(s))  CBC     Status: None   Collection Time: 05/21/19  6:16 AM  Result Value Ref Range   WBC 6.0 4.0 - 10.5 K/uL   RBC 5.27 4.22 - 5.81 MIL/uL   Hemoglobin 15.8 13.0 - 17.0 g/dL   HCT 48.4 39.0 - 52.0 %   MCV 91.8 80.0 - 100.0 fL   MCH 30.0 26.0 - 34.0 pg   MCHC 32.6 30.0 - 36.0 g/dL   RDW 12.6 11.5 - 15.5 %   Platelets 214 150 - 400 K/uL   nRBC 0.0 0.0 - 0.2 %    Comment: Performed at [facility], [place] [address] [place], [place] [address]  Comprehensive metabolic panel     Status: Abnormal   Collection Time: 05/21/19  6:16 AM  Result Value Ref Range   Sodium 137 135 - 145 mmol/L   Potassium 4.1 3.5 - 5.1 mmol/L   Chloride 102 98 - 111 mmol/L   CO2 28 22 - 32 mmol/L   Glucose, Bld 99 70 - 99 mg/dL   BUN 15 6 - 20 mg/dL   Creatinine, Ser 0.88 0.61 - 1.24 mg/dL   Calcium 8.9 8.9 - 10.3 mg/dL   Total Protein 6.6 6.5 - 8.1 g/dL   Albumin 4.1 3.5 - 5.0 g/dL   AST 13 (L) 15 - 41 U/L   ALT 15 0 - 44 U/L   Alkaline Phosphatase 53 38 - 126 U/L   Total Bilirubin 1.2 0.3 - 1.2 mg/dL   GFR calc non Af Amer >60 >60 mL/min   GFR calc Af Amer >60 >60 mL/min   Anion gap 7 5 - 15    Comment: Performed at [facility], [place] [address], [place], [place] [address]    Studies/Results: Dg Abd 1 View  Result Date: 05/22/2019 CLINICAL DATA:  Ingestion of foreign bodies. EXAM: ABDOMEN - 1 VIEW COMPARISON:  Radiograph yesterday. FINDINGS: One residual foreign body projects over the left lower quadrant likely in the sigmoid colon. The remainder the foreign body soft cleared. No bowel dilatation or obstruction. No evidence of free air. Moderate stool burden. IMPRESSION: Single residual foreign body projects over the left lower quadrant, likely in the sigmoid colon. Remainder of the foreign bodies have cleared. Electronically Signed   By: Keith Rake M.D.   On: 05/22/2019 06:16   Dg Abd 1 View  Result Date: 05/21/2019 CLINICAL DATA:  Swallowed foreign body. EXAM: ABDOMEN - 1 VIEW COMPARISON:  Radiograph yesterday. FINDINGS: Seven radiopaque foreign bodies project over the pelvis in the region of the sigmoid colon and rectum. Single foreign body projects over the splenic flexure of the colon. No small bowel dilatation or obstruction. No evidence of free air.  IMPRESSION: Majority of foreign bodies are seen within the pelvis in the sigmoid colon and rectum, with a single foreign body in the splenic flexure of the colon. Electronically Signed   By: Narda Rutherford M.D.   On: 05/21/2019 06:48    Medications: I have reviewed the patient's current medications.  Assessment: Single residual foreign body projecting over left lower quadrant likely in sigmoid with remainder of foreign bodies cleared from colon  Plan: Continue Colace 200 mg twice a day with Metamucil 1 packet 3 times a day. Okay to send the patient back to prison today.  Kerin Salen, MD 05/22/2019, 10:39 AM

## 2019-06-02 ENCOUNTER — Other Ambulatory Visit: Payer: Self-pay

## 2019-06-02 ENCOUNTER — Inpatient Hospital Stay
Admission: EM | Admit: 2019-06-02 | Discharge: 2019-06-08 | DRG: 394 | Attending: Internal Medicine | Admitting: Internal Medicine

## 2019-06-02 ENCOUNTER — Inpatient Hospital Stay

## 2019-06-02 ENCOUNTER — Encounter: Payer: Self-pay | Admitting: *Deleted

## 2019-06-02 ENCOUNTER — Emergency Department

## 2019-06-02 DIAGNOSIS — Z87891 Personal history of nicotine dependence: Secondary | ICD-10-CM | POA: Diagnosis not present

## 2019-06-02 DIAGNOSIS — I10 Essential (primary) hypertension: Secondary | ICD-10-CM | POA: Diagnosis present

## 2019-06-02 DIAGNOSIS — Z20828 Contact with and (suspected) exposure to other viral communicable diseases: Secondary | ICD-10-CM | POA: Diagnosis present

## 2019-06-02 DIAGNOSIS — T17900S Unspecified foreign body in respiratory tract, part unspecified causing asphyxiation, sequela: Secondary | ICD-10-CM

## 2019-06-02 DIAGNOSIS — S51812A Laceration without foreign body of left forearm, initial encounter: Secondary | ICD-10-CM | POA: Diagnosis present

## 2019-06-02 DIAGNOSIS — F331 Major depressive disorder, recurrent, moderate: Secondary | ICD-10-CM | POA: Diagnosis present

## 2019-06-02 DIAGNOSIS — F4310 Post-traumatic stress disorder, unspecified: Secondary | ICD-10-CM | POA: Diagnosis present

## 2019-06-02 DIAGNOSIS — X788XXA Intentional self-harm by other sharp object, initial encounter: Secondary | ICD-10-CM | POA: Diagnosis present

## 2019-06-02 DIAGNOSIS — K5900 Constipation, unspecified: Secondary | ICD-10-CM | POA: Diagnosis present

## 2019-06-02 DIAGNOSIS — Z79899 Other long term (current) drug therapy: Secondary | ICD-10-CM

## 2019-06-02 DIAGNOSIS — T17908S Unspecified foreign body in respiratory tract, part unspecified causing other injury, sequela: Secondary | ICD-10-CM

## 2019-06-02 DIAGNOSIS — Z880 Allergy status to penicillin: Secondary | ICD-10-CM | POA: Diagnosis not present

## 2019-06-02 DIAGNOSIS — T189XXA Foreign body of alimentary tract, part unspecified, initial encounter: Secondary | ICD-10-CM | POA: Diagnosis present

## 2019-06-02 DIAGNOSIS — T184XXA Foreign body in colon, initial encounter: Principal | ICD-10-CM | POA: Diagnosis present

## 2019-06-02 DIAGNOSIS — G2581 Restless legs syndrome: Secondary | ICD-10-CM | POA: Diagnosis present

## 2019-06-02 DIAGNOSIS — T189XXD Foreign body of alimentary tract, part unspecified, subsequent encounter: Secondary | ICD-10-CM | POA: Diagnosis not present

## 2019-06-02 DIAGNOSIS — R109 Unspecified abdominal pain: Secondary | ICD-10-CM

## 2019-06-02 DIAGNOSIS — T1491XA Suicide attempt, initial encounter: Secondary | ICD-10-CM | POA: Diagnosis present

## 2019-06-02 LAB — COMPREHENSIVE METABOLIC PANEL
ALT: 11 U/L (ref 0–44)
AST: 18 U/L (ref 15–41)
Albumin: 4.5 g/dL (ref 3.5–5.0)
Alkaline Phosphatase: 56 U/L (ref 38–126)
Anion gap: 8 (ref 5–15)
BUN: 10 mg/dL (ref 6–20)
CO2: 27 mmol/L (ref 22–32)
Calcium: 9.1 mg/dL (ref 8.9–10.3)
Chloride: 103 mmol/L (ref 98–111)
Creatinine, Ser: 0.87 mg/dL (ref 0.61–1.24)
GFR calc Af Amer: >60 mL/min (ref >60)
GFR calc non Af Amer: >60 mL/min (ref >60)
Glucose, Bld: 102 mg/dL — ABNORMAL HIGH (ref 70–99)
Potassium: 3.9 mmol/L (ref 3.5–5.1)
Sodium: 138 mmol/L (ref 135–145)
Total Bilirubin: 1 mg/dL (ref 0.3–1.2)
Total Protein: 7.3 g/dL (ref 6.5–8.1)

## 2019-06-02 LAB — BASIC METABOLIC PANEL
Anion gap: 8 (ref 5–15)
BUN: 8 mg/dL (ref 6–20)
CO2: 27 mmol/L (ref 22–32)
Calcium: 8.9 mg/dL (ref 8.9–10.3)
Chloride: 105 mmol/L (ref 98–111)
Creatinine, Ser: 0.83 mg/dL (ref 0.61–1.24)
GFR calc Af Amer: >60 mL/min (ref >60)
GFR calc non Af Amer: >60 mL/min (ref >60)
Glucose, Bld: 93 mg/dL (ref 70–99)
Potassium: 3.6 mmol/L (ref 3.5–5.1)
Sodium: 140 mmol/L (ref 135–145)

## 2019-06-02 LAB — CBC
HCT: 45 % (ref 39.0–52.0)
HCT: 43.2 % (ref 39.0–52.0)
Hemoglobin: 16.2 g/dL (ref 13.0–17.0)
Hemoglobin: 15.4 g/dL (ref 13.0–17.0)
MCH: 30.5 pg (ref 26.0–34.0)
MCH: 30.1 pg (ref 26.0–34.0)
MCHC: 36 g/dL (ref 30.0–36.0)
MCHC: 35.6 g/dL (ref 30.0–36.0)
MCV: 84.6 fL (ref 80.0–100.0)
MCV: 84.5 fL (ref 80.0–100.0)
Platelets: 289 10*3/uL (ref 150–400)
Platelets: 271 10*3/uL (ref 150–400)
RBC: 5.32 MIL/uL (ref 4.22–5.81)
RBC: 5.11 MIL/uL (ref 4.22–5.81)
RDW: 12.3 % (ref 11.5–15.5)
RDW: 12.4 % (ref 11.5–15.5)
WBC: 8.2 10*3/uL (ref 4.0–10.5)
WBC: 7.2 10*3/uL (ref 4.0–10.5)
nRBC: 0 % (ref 0.0–0.2)
nRBC: 0 % (ref 0.0–0.2)

## 2019-06-02 LAB — ETHANOL: Alcohol, Ethyl (B): <10 mg/dL (ref <10)

## 2019-06-02 LAB — URINE DRUG SCREEN, QUALITATIVE (ARMC ONLY)
Amphetamines, Ur Screen: NOT DETECTED
Barbiturates, Ur Screen: NOT DETECTED
Benzodiazepine, Ur Scrn: NOT DETECTED
Cannabinoid 50 Ng, Ur ~~LOC~~: NOT DETECTED
Cocaine Metabolite,Ur ~~LOC~~: NOT DETECTED
MDMA (Ecstasy)Ur Screen: NOT DETECTED
Methadone Scn, Ur: NOT DETECTED
Opiate, Ur Screen: NOT DETECTED
Phencyclidine (PCP) Ur S: NOT DETECTED
Tricyclic, Ur Screen: NOT DETECTED

## 2019-06-02 LAB — SARS CORONAVIRUS 2 (TAT 6-24 HRS): SARS Coronavirus 2: NEGATIVE

## 2019-06-02 LAB — ACETAMINOPHEN LEVEL: Acetaminophen (Tylenol), Serum: <10 ug/mL — ABNORMAL LOW (ref 10–30)

## 2019-06-02 LAB — SALICYLATE LEVEL: Salicylate Lvl: <7 mg/dL (ref 2.8–30.0)

## 2019-06-02 MED ORDER — DOCUSATE SODIUM 100 MG PO CAPS
200.0000 mg | ORAL_CAPSULE | Freq: Two times a day (BID) | ORAL | Status: DC
  Administered 2019-06-02 – 2019-06-08 (×13): 200 mg via ORAL
  Filled 2019-06-02 (×13): qty 2

## 2019-06-02 MED ORDER — TRAZODONE HCL 50 MG PO TABS
25.0000 mg | ORAL_TABLET | Freq: Every evening | ORAL | Status: DC | PRN
  Filled 2019-06-02: qty 0.5
  Filled 2019-06-02 (×2): qty 1

## 2019-06-02 MED ORDER — ACETAMINOPHEN 650 MG RE SUPP: 650.0000 mg | Freq: Four times a day (QID) | RECTAL | Status: DC | PRN

## 2019-06-02 MED ORDER — ONDANSETRON HCL 4 MG PO TABS
4.0000 mg | ORAL_TABLET | Freq: Four times a day (QID) | ORAL | Status: DC | PRN
  Filled 2019-06-02: qty 1

## 2019-06-02 MED ORDER — MAGNESIUM HYDROXIDE 400 MG/5ML PO SUSP
30.0000 mL | Freq: Every day | ORAL | Status: DC | PRN
  Filled 2019-06-02: qty 30

## 2019-06-02 MED ORDER — PSYLLIUM 95 % PO PACK
1.0000 | PACK | Freq: Three times a day (TID) | ORAL | Status: DC
  Administered 2019-06-03 – 2019-06-08 (×9): 1 via ORAL
  Filled 2019-06-02 (×27): qty 1

## 2019-06-02 MED ORDER — MORPHINE SULFATE (PF) 2 MG/ML IV SOLN
2.0000 mg | INTRAVENOUS | Status: DC | PRN
  Administered 2019-06-02: 09:00:00 2 mg via INTRAVENOUS
  Filled 2019-06-02: qty 1

## 2019-06-02 MED ORDER — MORPHINE SULFATE 15 MG PO TABS
15.0000 mg | ORAL_TABLET | ORAL | Status: DC | PRN
  Administered 2019-06-02 – 2019-06-03 (×5): 15 mg via ORAL
  Filled 2019-06-02 (×8): qty 1

## 2019-06-02 MED ORDER — PRAZOSIN HCL 2 MG PO CAPS
4.0000 mg | ORAL_CAPSULE | Freq: Every day | ORAL | Status: DC
  Administered 2019-06-02 – 2019-06-07 (×6): 4 mg via ORAL
  Filled 2019-06-02 (×6): qty 2
  Filled 2019-06-02: qty 4
  Filled 2019-06-02: qty 2

## 2019-06-02 MED ORDER — DICYCLOMINE HCL 10 MG PO CAPS
20.0000 mg | ORAL_CAPSULE | Freq: Once | ORAL | Status: AC
  Administered 2019-06-02: 06:00:00 20 mg via ORAL
  Filled 2019-06-02: qty 2

## 2019-06-02 MED ORDER — OXYCODONE-ACETAMINOPHEN 5-325 MG PO TABS
1.0000 | ORAL_TABLET | ORAL | Status: DC | PRN
  Filled 2019-06-02: qty 2

## 2019-06-02 MED ORDER — ACETAMINOPHEN 500 MG PO TABS
1000.0000 mg | ORAL_TABLET | Freq: Once | ORAL | Status: AC
  Administered 2019-06-02: 1000 mg via ORAL
  Filled 2019-06-02: qty 2

## 2019-06-02 MED ORDER — ACETAMINOPHEN 325 MG PO TABS: 650.0000 mg | ORAL_TABLET | Freq: Four times a day (QID) | ORAL | Status: DC | PRN

## 2019-06-02 MED ORDER — DEXTROSE-NACL 5-0.9 % IV SOLN
INTRAVENOUS | Status: DC
  Administered 2019-06-02 – 2019-06-04 (×4): via INTRAVENOUS

## 2019-06-02 MED ORDER — ONDANSETRON HCL 4 MG/2ML IJ SOLN
4.0000 mg | Freq: Four times a day (QID) | INTRAMUSCULAR | Status: DC | PRN
  Administered 2019-06-02: 4 mg via INTRAVENOUS
  Filled 2019-06-02: qty 2

## 2019-06-02 MED ORDER — ENOXAPARIN SODIUM 40 MG/0.4ML ~~LOC~~ SOLN: 40.0000 mg | SUBCUTANEOUS | Status: DC

## 2019-06-02 MED ORDER — SERTRALINE HCL 25 MG PO TABS
25.0000 mg | ORAL_TABLET | Freq: Every day | ORAL | Status: DC
  Administered 2019-06-02 – 2019-06-04 (×3): 25 mg via ORAL
  Filled 2019-06-02 (×3): qty 1

## 2019-06-02 MED ORDER — SODIUM CHLORIDE 0.9 % IV SOLN
INTRAVENOUS | Status: DC
  Administered 2019-06-02: 06:00:00 via INTRAVENOUS

## 2019-06-02 MED ORDER — ACETAMINOPHEN 500 MG PO TABS
1000.0000 mg | ORAL_TABLET | Freq: Three times a day (TID) | ORAL | Status: DC
  Administered 2019-06-02 – 2019-06-04 (×4): 1000 mg via ORAL
  Filled 2019-06-02 (×4): qty 2

## 2019-06-02 NOTE — ED Notes (Signed)
Pt given ice chips

## 2019-06-02 NOTE — ED Triage Notes (Signed)
Pt brought in by [employer].  Pt is handcuffed and schackled.  Pt swallowed a razor blade at 0100 tonight.  Pt reports coughing up blood.  Pt has cuts on razor hand. Pt reports SI.  Denies drug use or etoh use.  Pt calm and cooperative.

## 2019-06-02 NOTE — ED Notes (Signed)
Hourly rounding reveals patient in room. No complaints, stable, in no acute distress. Q15 minute rounds and monitoring via Rover and Officer to continue.   

## 2019-06-02 NOTE — ED Notes (Signed)
Pt given broth. Pt appears comfortable at this time resting in bed with bed locked and in lowest position. Pt remains handcuffed to bed with 2 officers at bedside.

## 2019-06-02 NOTE — ED Notes (Signed)
Officers at bedside, pt Rt arm cuffed to bed by officers

## 2019-06-02 NOTE — ED Notes (Signed)
Pt given the rest of clear liquid meal tray.

## 2019-06-02 NOTE — ED Notes (Signed)
Patient c/o abdominal pain. Notified EDP.

## 2019-06-02 NOTE — H&P (Addendum)
[place] at [facility]   PATIENT NAME: Christopher Stephenson    MR#:  [id number]  DATE OF BIRTH:  06-Jan-1986  DATE OF ADMISSION:  06/02/2019  PRIMARY CARE PHYSICIAN: Patient, No Pcp Per   REQUESTING/REFERRING PHYSICIAN: Phineas SemenGoodman, Gray[place], MD  CHIEF COMPLAINT:   Chief Complaint  Patient presents with  . Suicide Attempt    HISTORY OF PRESENT ILLNESS:  Christopher Stephenson  is a 33 y.o. male with a known history of depression, posttraumatic stress disorder and hypertension who is an inmate and presents to the emergency room after swallowing a razor blade with the intention of hurting himself.  He stated that I wanted to end it all and admitted to trying to kill himself.  He also admitted to briefly coughing up blood in jail but there was no evidence of hemoptysis here.  There was no evidence for hematemesis either..  He denied any nausea or vomiting however admitted to abdominal pain mainly in the epigastric and left upper quadrant.  No diarrhea but he admits to constipation.  His last bowel movement was 30 minutes before he came.  Upon presentation to the emergency room, blood pressure 155/88 with otherwise normal vital signs.  Labs revealed no significant abnormalities.  Tylenol levels less than 10 salicylate less than 7.  Urine drug screen came back negative two-view abdomen x-ray showed 1.1 cm metallic foreign body seen within the left upper quadrant likely within small bowel loop with nonobstructive bowel gas pattern and no pneumoperitoneum.  The patient was given 1 g of p.o. Tylenol and 20 mg p.o. Bentyl.  He will be admitted to a medical bed for further evaluation and monitoring.  PAST MEDICAL HISTORY:  Depression, posttraumatic stress disorder and hypertension  PAST SURGICAL HISTORY:  None per the patient  SOCIAL HISTORY:   Social History   Tobacco Use  . Smoking status: Former Smoker    Packs/day: 0.50    Types: Cigarettes  . Smokeless tobacco: Never Used  Substance Use Topics   . Alcohol use: Not Currently    FAMILY HISTORY:  He denied any familial diseases  DRUG ALLERGIES:   Allergies  Allergen Reactions  . Penicillins Other (See Comments)    Unsure Did it involve swelling of the face/tongue/throat, SOB, or low BP? unknown Did it involve sudden or severe rash/hives, skin peeling, or any reaction on the inside of your mouth or nose? unknown Did you need to seek medical attention at a hospital or doctor's office? unknown When did it last happen?childhood allergy If all above answers are "NO", may proceed with cephalosporin use.     REVIEW OF SYSTEMS:   ROS As per history of present illness. All pertinent systems were reviewed above. Constitutional,  HEENT, cardiovascular, respiratory, GI, GU, musculoskeletal, neuro, psychiatric, endocrine,  integumentary and hematologic systems were reviewed and are otherwise  negative/unremarkable except for positive findings mentioned above in the HPI.   MEDICATIONS AT HOME:   Prior to Admission medications   Medication Sig Start Date End Date Taking? Authorizing Provider  docusate sodium (COLACE) 100 MG capsule Take 2 capsules (200 mg total) by mouth 2 (two) times daily. 05/22/19   Alwyn RenMathews, Elizabeth G, MD  prazosin (MINIPRESS) 2 MG capsule Take 2 capsules (4 mg total) by mouth at bedtime. 05/22/19   Alwyn RenMathews, Elizabeth G, MD  psyllium (HYDROCIL/METAMUCIL) 95 % PACK Take 1 packet by mouth 3 (three) times daily. 05/22/19   Alwyn RenMathews, Elizabeth G, MD  sertraline (ZOLOFT) 25 MG tablet Take 1  tablet (25 mg total) by mouth daily. 05/22/19 05/21/20  Georgette Shell, MD      VITAL SIGNS:  Blood pressure (!) 155/88, pulse 79, temperature 98.9 F (37.2 C), temperature source Oral, resp. rate 18, height 5\' 9"  (1.753 m), weight 74.8 kg, SpO2 97 %.  PHYSICAL EXAMINATION:  Physical Exam  GENERAL:  33 y.o.-year-old Caucasian male patient lying in the bed with mild distress distress from assumed pain.  EYES:  Pupils equal, round, reactive to light and accommodation. No scleral icterus. Extraocular muscles intact.  HEENT: Head atraumatic, normocephalic. Oropharynx and nasopharynx clear.  NECK:  Supple, no jugular venous distention. No thyroid enlargement, no tenderness.  LUNGS: Normal breath sounds bilaterally, no wheezing, rales,rhonchi or crepitation. No use of accessory muscles of respiration.  CARDIOVASCULAR: Regular rate and rhythm, S1, S2 normal. No murmurs, rubs, or gallops.  ABDOMEN: Soft, nondistended, with minimal epigastric and left upper quadrant tenderness without rebound tenderness guarding or rigidity bowel sounds present. No organomegaly or mass.  EXTREMITIES: No pedal edema, cyanosis, or clubbing.  NEUROLOGIC: Cranial nerves II through XII are intact. Muscle strength 5/5 in all extremities. Sensation intact. Gait not checked.  PSYCHIATRIC: The patient is alert and oriented x 3.  Normal affect and good eye contact. SKIN: No obvious rash, lesion, or ulcer.   LABORATORY PANEL:   CBC Recent Labs  Lab 06/02/19 0153  WBC 8.2  HGB 16.2  HCT 45.0  PLT 289   ------------------------------------------------------------------------------------------------------------------  Chemistries  Recent Labs  Lab 06/02/19 0153  NA 138  K 3.9  CL 103  CO2 27  GLUCOSE 102*  BUN 10  CREATININE 0.87  CALCIUM 9.1  AST 18  ALT 11  ALKPHOS 56  BILITOT 1.0   ------------------------------------------------------------------------------------------------------------------  Cardiac Enzymes No results for input(s): TROPONINI in the last 168 hours. ------------------------------------------------------------------------------------------------------------------  RADIOLOGY:  Dg Abd Acute W/chest  Result Date: 06/02/2019 CLINICAL DATA:  Patient swallowed razor blade EXAM: DG ABDOMEN ACUTE W/ 1V CHEST COMPARISON:  None. FINDINGS: There is no evidence of dilated bowel loops or free  intraperitoneal air. Within the left upper quadrant, likely within a small bowel loop there is a 1.1 cm metallic foreign body. Air and stool seen in nondilated loops of bowel. The lungs are clear. No focal airspace consolidation or pleural effusion. IMPRESSION: 1.1 cm metallic foreign body seen within the left upper quadrant, likely within small bowel loop. Nonobstructive bowel gas pattern. No pneumoperitoneum. No acute cardiopulmonary process Electronically Signed   By: Prudencio Pair M.D.   On: 06/02/2019 02:27    IMPRESSION AND PLAN:   1.  Suicidal attempt, swallowing a razor blade.  The patient will be admitted to medical bed.  Pain management to be provided.  We will follow serial 2D abdomen x-rays.  Dr. Celine Ahr was notified about the patient and is aware.  The patient will be on suicide precautions.  We will obtain a psychiatry consultation.  2.  Depression and PTSD.  We will continue Zoloft.  3.  Hypertension.  We will continue prazosin.  4.  Constipation.  We will continue Colace and Metamucil.  5.  DVT prophylaxis.  Subcutaneous Lovenox   All the records are reviewed and case discussed with ED provider. The plan of care was discussed in details with the patient (and family). I answered all questions. The patient agreed to proceed with the above mentioned plan. Further management will depend upon hospital course.   CODE STATUS: Full code  TOTAL TIME TAKING CARE OF THIS PATIENT:  50 minutes.    Hannah Beat M.D on 06/02/2019 at 5:04 AM  [facility]   From 7 PM-7 AM, contact night-coverage [email or web address]  CC: Primary care physician; Patient, No Pcp Per   Note: This dictation was prepared with Dragon dictation along with smaller phrase technology. Any transcriptional errors that result from this process are unintentional.

## 2019-06-02 NOTE — ED Notes (Signed)
Pt unable to void at this time. 

## 2019-06-02 NOTE — ED Notes (Signed)
Pt. Transferred from Triage to room [facility] after dressing out and screening for contraband. Report to include Situation, Background, Assessment and Recommendations from RN. Pt. Oriented to Quad including Q15 minute rounds as well as Engineer, drilling for their protection. Patient is alert and oriented, warm and dry in no acute distress. Patient reported SI attempted to cut his wrist with razor but ended up swallowing it. Denied HI, and AVH. Pt. Encouraged to let me know if needs arise.

## 2019-06-02 NOTE — Progress Notes (Signed)
PROGRESS NOTE    Simonne Maffucci Alwyn Ren.  STM:[id number] DOB: 1985-08-29 DOA: 06/02/2019 PCP: Patient, No Pcp Per      Brief Narrative:  Mr. Broaddus is a 33 y.o. M with hx depression and HTN and PTSD presented with intentional ingestion of a razor blade.  Patient is in police custody, obtained a razor blade and attempted to slit his wrists.  He saw a guard coming, and so he swallowed the razor blade.  In the ER, radiograph confirmed a radioopaque object superimposed over the small bowel.  He had abdominal pain.  Psych were consulted who recommended no psychiatric treatment and stated "Patient is not suicidal, homicidal or psychotic and does not need inpatient psychiatric care."    GI and General Surgery were consulted who recommended serial abdominal exams.        Assessment & Plan:  Suicide attempt by razor blade -NPO -MIVF -Serial abdominal exams -Daily abd x-ray -Consult General Surgery -Consult Psychiatry   PTSD -Continue prazosin, sertraline    DVT prophylaxis: SCDs Code Status: FUL Family Communication:  MDM and disposition Plan: This is a no charge note.  For further details, please see H&P by my partner Dr. Arville Care from earlier today.  The below labs and imaging reports were reviewed and summarized above.    The patient was admitted with ingestion of razor blade.    Objective: Vitals:   06/02/19 0154 06/02/19 0721 06/02/19 0833 06/02/19 1030  BP:  117/77 (!) 142/77 116/73  Pulse:  70  78  Resp:  20 18   Temp:  98.2 F (36.8 C)    TempSrc:  Oral    SpO2:  100%  99%  Weight: 74.8 kg     Height: 5\' 9"  (1.753 m)      No intake or output data in the 24 hours ending 06/02/19 1404 Filed Weights   06/02/19 0147 06/02/19 0154  Weight: 72.6 kg 74.8 kg    Examination: The patient was seen and examined.      Data Reviewed: I have personally reviewed following labs and imaging studies:  CBC: Recent Labs  Lab 06/02/19 0153 06/02/19 0543  WBC 8.2 7.2   HGB 16.2 15.4  HCT 45.0 43.2  MCV 84.6 84.5  PLT 289 271   Basic Metabolic Panel: Recent Labs  Lab 06/02/19 0153 06/02/19 0543  NA 138 140  K 3.9 3.6  CL 103 105  CO2 27 27  GLUCOSE 102* 93  BUN 10 8  CREATININE 0.87 0.83  CALCIUM 9.1 8.9   GFR: Estimated Creatinine Clearance: 126.6 mL/min (by C-G formula based on SCr of 0.83 mg/dL). Liver Function Tests: Recent Labs  Lab 06/02/19 0153  AST 18  ALT 11  ALKPHOS 56  BILITOT 1.0  PROT 7.3  ALBUMIN 4.5   No results for input(s): LIPASE, AMYLASE in the last 168 hours. No results for input(s): AMMONIA in the last 168 hours. Coagulation Profile: No results for input(s): INR, PROTIME in the last 168 hours. Cardiac Enzymes: No results for input(s): CKTOTAL, CKMB, CKMBINDEX, TROPONINI in the last 168 hours. BNP (last 3 results) No results for input(s): PROBNP in the last 8760 hours. HbA1C: No results for input(s): HGBA1C in the last 72 hours. CBG: No results for input(s): GLUCAP in the last 168 hours. Lipid Profile: No results for input(s): CHOL, HDL, LDLCALC, TRIG, CHOLHDL, LDLDIRECT in the last 72 hours. Thyroid Function Tests: No results for input(s): TSH, T4TOTAL, FREET4, T3FREE, THYROIDAB in the last 72 hours. Anemia  Panel: No results for input(s): VITAMINB12, FOLATE, FERRITIN, TIBC, IRON, RETICCTPCT in the last 72 hours. Urine analysis: No results found for: COLORURINE, APPEARANCEUR, LABSPEC, PHURINE, GLUCOSEU, HGBUR, BILIRUBINUR, KETONESUR, PROTEINUR, UROBILINOGEN, NITRITE, LEUKOCYTESUR Sepsis Labs: @LABRCNTIP (procalcitonin:4,lacticacidven:4)  )No results found for this or any previous visit (from the past 240 hour(s)).       Radiology Studies: Dg Abd 2 Views  Result Date: 06/02/2019 CLINICAL DATA:  Patient swallowed a razor blade. EXAM: ABDOMEN - 2 VIEW COMPARISON:  Earlier film, same date. FINDINGS: Radiopaque foreign body is again demonstrated in the upper abdomen, likely in the antral region of the  stomach. The lung bases are clear. Bowel gas pattern is unremarkable. Moderate stool throughout the colon. No free air. IMPRESSION: Metallic radiopaque foreign body noted in the upper central abdomen, likely in the body or antral region of the stomach. No findings for obstruction or perforation. Electronically Signed   By: Marijo Sanes M.D.   On: 06/02/2019 06:04   Dg Abd Acute W/chest  Result Date: 06/02/2019 CLINICAL DATA:  Patient swallowed razor blade EXAM: DG ABDOMEN ACUTE W/ 1V CHEST COMPARISON:  None. FINDINGS: There is no evidence of dilated bowel loops or free intraperitoneal air. Within the left upper quadrant, likely within a small bowel loop there is a 1.1 cm metallic foreign body. Air and stool seen in nondilated loops of bowel. The lungs are clear. No focal airspace consolidation or pleural effusion. IMPRESSION: 1.1 cm metallic foreign body seen within the left upper quadrant, likely within small bowel loop. Nonobstructive bowel gas pattern. No pneumoperitoneum. No acute cardiopulmonary process Electronically Signed   By: Prudencio Pair M.D.   On: 06/02/2019 02:27        Scheduled Meds: . acetaminophen  1,000 mg Oral TID  . docusate sodium  200 mg Oral BID  . prazosin  4 mg Oral QHS  . psyllium  1 packet Oral TID  . sertraline  25 mg Oral Daily   Continuous Infusions: . dextrose 5 % and 0.9% NaCl       LOS: 0 days    Time spent: 15 minutes    Edwin Dada, MD [facility] 06/02/2019, 2:04 PM     Please page though Pray or Epic secure chat:  For password, contact charge nurse

## 2019-06-02 NOTE — Consult Note (Signed)
[facility] Face-to-Face Psychiatry Consult   Reason for Consult:  Intentional swallowing of a razor blade Referring Physician:  Dr. Derrill Kay Patient Identification: Christopher Stephenson. MRN:  [id number] Principal Diagnosis: Ingestion of foreign object Diagnosis:  Active Problems:   Ingestion of foreign body   Intentional self-harm by razor blade (HCC)   Foreign body aspiration, sequela   Major depressive disorder, recurrent episode, moderate (HCC)   Suicidal behavior with attempted self-injury (HCC)  Total Time spent with patient: 30 mins  Subjective: "I am hurting and I do not want to talk." Christopher Churn. is a 33 y.o. male patient presented to Christopher Stephenson via law enforcement and with continuous monitoring of officers. The patient is seen for intentional ingestion of a foreign object. The patient states, "I am hurting, and I do not want to talk."  The patient was seen face-to-face by this provider; chart reviewed and consulted with Dr. Derrill Kay on 06/02/2019 due to the patient's care. It was discussed with the EDP that the patient does not meet criteria to be admitted to the psychiatric inpatient unit.  It was discussed that the patient is being monitored by law enforcement to prevent him from attempting to self-injure. On evaluation, the patient is alert and oriented x 3; he is anxious, uncooperative, and mood-congruent with affect. The patient refused to answer all questions stating, "I am in too much pain I can talk."  Plan: The patient is not a safety risk to self or others and does not require psychiatric inpatient admission for stabilization and treatment.  HPI per Dr. Derrill Kay; Christopher Maffucci Christopher Silva. is a 33 y.o. male who presents to the emergency department today from prison after ingesting a razor blade.  He states this happened roughly 2 hours prior to my evaluation.  The patient initially tried cutting his left forearm with a razor blades but states once the guard started coming he decided to  swallow them.  The patient says that he has felt depressed and has wanted to hurt himself since he was 33 years old.  He is complaining of some discomfort in his abdomen describes it as a vague itchy sensation.  Past Psychiatric History:  Depression Patient has a history of multiple episodes of ingesting foreign bodies, self harm.   Risk to Self:  No Risk to Others:  No Prior Inpatient Therapy:   Yes (In prison) Prior Outpatient Therapy:   (In prison)  Past Medical History: No past medical history on file. No past surgical history on file. Family History: No family history on file. Family Psychiatric  History: none Social History:  Social History   Substance and Sexual Activity  Alcohol Use Not Currently     Social History   Substance and Sexual Activity  Drug Use Not Currently    Social History   Socioeconomic History  . Marital status: Single    Spouse name: Not on file  . Number of children: Not on file  . Years of education: Not on file  . Highest education level: Not on file  Occupational History  . Not on file  Social Needs  . Financial resource strain: Not on file  . Food insecurity    Worry: Not on file    Inability: Not on file  . Transportation needs    Medical: Not on file    Non-medical: Not on file  Tobacco Use  . Smoking status: Former Smoker    Packs/day: 0.50    Types: Cigarettes  .  Smokeless tobacco: Never Used  Substance and Sexual Activity  . Alcohol use: Not Currently  . Drug use: Not Currently  . Sexual activity: Not on file  Lifestyle  . Physical activity    Days per week: Not on file    Minutes per session: Not on file  . Stress: Not on file  Relationships  . Social Herbalist on phone: Not on file    Gets together: Not on file    Attends religious service: Not on file    Active member of club or organization: Not on file    Attends meetings of clubs or organizations: Not on file    Relationship status: Not on file   Other Topics Concern  . Not on file  Social History Narrative  . Not on file   Additional Social History:    Allergies:   Allergies  Allergen Reactions  . Penicillins Other (See Comments)    Unsure Did it involve swelling of the face/tongue/throat, SOB, or low BP? unknown Did it involve sudden or severe rash/hives, skin peeling, or any reaction on the inside of your mouth or nose? unknown Did you need to seek medical attention at a hospital or doctor's office? unknown When did it last happen?childhood allergy If all above answers are "NO", may proceed with cephalosporin use.     Labs:  Results for orders placed or performed during the hospital encounter of 06/02/19 (from the past 48 hour(s))  Comprehensive metabolic panel     Status: Abnormal   Collection Time: 06/02/19  1:53 AM  Result Value Ref Range   Sodium 138 135 - 145 mmol/L   Potassium 3.9 3.5 - 5.1 mmol/L   Chloride 103 98 - 111 mmol/L   CO2 27 22 - 32 mmol/L   Glucose, Bld 102 (H) 70 - 99 mg/dL   BUN 10 6 - 20 mg/dL   Creatinine, Ser 0.87 0.61 - 1.24 mg/dL   Calcium 9.1 8.9 - 10.3 mg/dL   Total Protein 7.3 6.5 - 8.1 g/dL   Albumin 4.5 3.5 - 5.0 g/dL   AST 18 15 - 41 U/L   ALT 11 0 - 44 U/L   Alkaline Phosphatase 56 38 - 126 U/L   Total Bilirubin 1.0 0.3 - 1.2 mg/dL   GFR calc non Af Amer >60 >60 mL/min   GFR calc Af Amer >60 >60 mL/min   Anion gap 8 5 - 15    Comment: Performed at [facility], [address] [place], [place] [address]  Ethanol     Status: None   Collection Time: 06/02/19  1:53 AM  Result Value Ref Range   Alcohol, Ethyl (B) <10 <10 mg/dL    Comment: (NOTE) Lowest detectable limit for serum alcohol is 10 mg/dL. For medical purposes only. Performed at [facility], [address] [place], [place] [address]   Salicylate level     Status: None   Collection Time: 06/02/19  1:53 AM  Result Value Ref Range   Salicylate Lvl <7.5 2.8 - 30.0 mg/dL    Comment:  Performed at [facility], [address] [place],  [address]  Acetaminophen level     Status: Abnormal   Collection Time: 06/02/19  1:53 AM  Result Value Ref Range   Acetaminophen (Tylenol), Serum <10 (L) 10 - 30 ug/mL    Comment: (NOTE) Therapeutic concentrations vary significantly. A range of 10-30 ug/mL  may be an effective concentration for many  patients. However, some  are best treated at concentrations outside of this range. Acetaminophen concentrations >150 ug/mL at 4 hours after ingestion  and >50 ug/mL at 12 hours after ingestion are often associated with  toxic reactions. Performed at [facility], [address] [place], [place] [address]   cbc     Status: None   Collection Time: 06/02/19  1:53 AM  Result Value Ref Range   WBC 8.2 4.0 - 10.5 K/uL   RBC 5.32 4.22 - 5.81 MIL/uL   Hemoglobin 16.2 13.0 - 17.0 g/dL   HCT 23.5 57.3 - 22.0 %   MCV 84.6 80.0 - 100.0 fL   MCH 30.5 26.0 - 34.0 pg   MCHC 36.0 30.0 - 36.0 g/dL   RDW 25.4 27.0 - 62.3 %   Platelets 289 150 - 400 K/uL   nRBC 0.0 0.0 - 0.2 %    Comment: Performed at [facility], [address] [place], [place] [address]    No current facility-administered medications for this encounter.    Current Outpatient Medications  Medication Sig Dispense Refill  . docusate sodium (COLACE) 100 MG capsule Take 2 capsules (200 mg total) by mouth 2 (two) times daily. 10 capsule 0  . prazosin (MINIPRESS) 2 MG capsule Take 2 capsules (4 mg total) by mouth at bedtime. 60 capsule 1  . psyllium (HYDROCIL/METAMUCIL) 95 % PACK Take 1 packet by mouth 3 (three) times daily. 240 each   . sertraline (ZOLOFT) 25 MG tablet Take 1 tablet (25 mg total) by mouth daily. 30 tablet 2    Musculoskeletal: Strength & Muscle Tone: decreased Gait & Station: did not witness Patient leans: N/A  Psychiatric Specialty Exam: Physical Exam  Nursing note and vitals reviewed. Constitutional: He is oriented to  person, place, and time. He appears well-developed and well-nourished.  HENT:  Head: Normocephalic.  Neck: Normal range of motion. Neck supple.  Respiratory: Effort normal.  Musculoskeletal: Normal range of motion.  Neurological: He is alert and oriented to person, place, and time.  Psychiatric: His speech is normal and behavior is normal. Thought content normal. His mood appears anxious. Cognition and memory are normal. He expresses impulsivity. He exhibits a depressed mood.    Review of Systems  Psychiatric/Behavioral: Positive for depression. The patient is nervous/anxious.   All other systems reviewed and are negative.   Blood pressure (!) 155/88, pulse 79, temperature 98.9 F (37.2 C), temperature source Oral, resp. rate 18, height 5\' 9"  (1.753 m), weight 74.8 kg, SpO2 97 %.Body mass index is 24.37 kg/m.  General Appearance: Casual  Eye Contact:  Good  Speech:  Normal Rate  Volume:  Normal  Mood:  Anxious and Depressed  Affect:  Congruent  Thought Process:  Coherent and Descriptions of Associations: Intact  Orientation:  Full (Time, Place, and Person)  Thought Content:  Rumination  Suicidal Thoughts:  Yes.  without intent/plan  Homicidal Thoughts:  No  Memory:  Immediate;   Fair Recent;   Fair Remote;   Fair  Judgement:  Poor  Insight:  Fair  Psychomotor Activity:  Decreased  Concentration:  Concentration: Fair and Attention Span: Fair  Recall:  of Knowledge:  Fair  Language:  Good  Akathisia:  No  Handed:  Right  AIMS (if indicated):     Assets:  Housing Leisure Time Physical Health Resilience  ADL's:  Intact  Cognition:  WNL  Sleep:      Treatment Plan Summary: Patient does not meet  criteria for psychiatric inpatient admission due to him being in the custody of law enforcement.  Disposition: No evidence of imminent risk to self or others at present.   Patient does not meet criteria for psychiatric inpatient admission. Supportive therapy provided  about ongoing stressors.  Gillermo MurdochJacqueline Thompson, NP 06/02/2019 4:33 AM

## 2019-06-02 NOTE — ED Notes (Signed)
Patient continues to complain of pain in his stomach. Notified Admitting provider.

## 2019-06-02 NOTE — ED Notes (Addendum)
Pt returned from xray. Will continue to monitor Q15 minute rounds.

## 2019-06-02 NOTE — Consult Note (Addendum)
[facility] SURGICAL CONSULTATION NOTE (initial) - cpt: 11914: [id number]   HISTORY OF PRESENT ILLNESS (HPI):  33 y.o. male presented to [facility] ED overnight for evaluation of swallowed foreign body. Patient presented to the emergency department from prison after ingesting a razor blade. He was reportedly attempting to cut his left forearm but a prison guard was coming by so he decided to swallow the razor blade. On presentation, he had complained of some generalized abdominal discomfort but otherwise had no other complaints. He does reportedly have a history of depression and self harm since he was ~33 years old. On chart review, he has an extensive history of similar. In November of this year he was [facility] to [facility] after ingesting razor blades (~3-4). He was monitored and assessed by GI there and eventually these passed without issue. However, he is adamant in the ED that this current ingestion is the only time he has swallowed razor blades. In the ED his abdomen remained soft and non-peritonitic. Abdominal films confirmed metallic foreign body in the left upper abdomen. He was admitted to medicine for serial abdominal examinations. Since admission, he reports that he has had some relief in abdominal pain, no worsening. No additional symptoms.   Surgery is consulted by emergency medicine physician Dr. Olga CoasterGrayson Goodman, MD in this context for evaluation and management of swallowed foreign body (razor blade).   PAST MEDICAL HISTORY (PMH):  Depression, SI, self-harm   PAST SURGICAL HISTORY (PSH):   Had rigid esophagoscopy in 2015 at [facility] for retrieval of ingested foreign body.  MEDICATIONS:  Prior to Admission medications   Medication Sig Start Date End Date Taking? Authorizing Provider  prazosin (MINIPRESS) 2 MG capsule Take 2 capsules (4 mg total) by mouth at bedtime. 05/22/19  Yes Alwyn RenMathews, Elizabeth G, MD  psyllium (HYDROCIL/METAMUCIL) 95 % PACK Take 1 packet by mouth 3 (three) times  daily. 05/22/19  Yes Alwyn RenMathews, Elizabeth G, MD  sertraline (ZOLOFT) 25 MG tablet Take 1 tablet (25 mg total) by mouth daily. 05/22/19 05/21/20 Yes Alwyn RenMathews, Elizabeth G, MD  docusate sodium (COLACE) 100 MG capsule Take 2 capsules (200 mg total) by mouth 2 (two) times daily. Patient not taking: Reported on 06/02/2019 05/22/19   Alwyn RenMathews, Elizabeth G, MD     ALLERGIES:  Allergies  Allergen Reactions  . Penicillins Other (See Comments)    Unsure Did it involve swelling of the face/tongue/throat, SOB, or low BP? unknown Did it involve sudden or severe rash/hives, skin peeling, or any reaction on the inside of your mouth or nose? unknown Did you need to seek medical attention at a hospital or doctor's office? unknown When did it last happen?childhood allergy If all above answers are "NO", may proceed with cephalosporin use.      SOCIAL HISTORY:  Social History   Socioeconomic History  . Marital status: Single    Spouse name: Not on file  . Number of children: Not on file  . Years of education: Not on file  . Highest education level: Not on file  Occupational History  . Not on file  Social Needs  . Financial resource strain: Not on file  . Food insecurity    Worry: Not on file    Inability: Not on file  . Transportation needs    Medical: Not on file    Non-medical: Not on file  Tobacco Use  . Smoking status: Former Smoker    Packs/day: 0.50    Types: Cigarettes  . Smokeless tobacco: Never Used  Substance and Sexual Activity  . Alcohol use: Not Currently  . Drug use: Not Currently  . Sexual activity: Not on file  Lifestyle  . Physical activity    Days per week: Not on file    Minutes per session: Not on file  . Stress: Not on file  Relationships  . Social Herbalist on phone: Not on file    Gets together: Not on file    Attends religious service: Not on file    Active member of club or organization: Not on file    Attends meetings of clubs or  organizations: Not on file    Relationship status: Not on file  . Intimate partner violence    Fear of current or ex partner: Not on file    Emotionally abused: Not on file    Physically abused: Not on file    Forced sexual activity: Not on file  Other Topics Concern  . Not on file  Social History Narrative  . Not on file     FAMILY HISTORY:  No family history on file.    REVIEW OF SYSTEMS:  Review of Systems  Constitutional: Negative for chills and fever.  HENT: Negative for congestion and sore throat.   Respiratory: Negative for cough and shortness of breath.   Cardiovascular: Negative for chest pain and palpitations.  Gastrointestinal: Positive for abdominal pain. Negative for constipation, diarrhea, nausea and vomiting.  Genitourinary: Negative for dysuria and urgency.  All other systems reviewed and are negative.   VITAL SIGNS:  Temp:  [98.2 F (36.8 C)-98.9 F (37.2 C)] 98.2 F (36.8 C) (12/03 0721) Pulse Rate:  [70-79] 70 (12/03 0721) Resp:  [18-20] 20 (12/03 0721) BP: (117-155)/(77-88) 117/77 (12/03 0721) SpO2:  [97 %-100 %] 100 % (12/03 0721) Weight:  [72.6 kg-74.8 kg] 74.8 kg (12/03 0154)     Height: 5\' 9"  (175.3 cm) Weight: 74.8 kg BMI (Calculated): 24.36   INTAKE/OUTPUT:  No intake/output data recorded.  PHYSICAL EXAM:  Physical Exam Vitals signs and nursing note reviewed. Exam conducted with a chaperone present.  Constitutional:      General: He is not in acute distress.    Appearance: Normal appearance. He is normal weight. He is not ill-appearing.     Comments: Prison guards at bedside  HENT:     Head: Normocephalic and atraumatic.  Eyes:     General: No scleral icterus.    Conjunctiva/sclera: Conjunctivae normal.  Cardiovascular:     Rate and Rhythm: Normal rate and regular rhythm.     Pulses: Normal pulses.     Heart sounds: No murmur. No friction rub. No gallop.   Pulmonary:     Effort: Pulmonary effort is normal. No respiratory distress.      Breath sounds: Normal breath sounds. No wheezing or rhonchi.  Abdominal:     General: Abdomen is flat. There is no distension.     Tenderness: There is no abdominal tenderness. There is no guarding or rebound.     Comments: Abdomen is soft, non-distended, non-tender, no rebound/guarding. No evidence of peritonitis  Genitourinary:    Comments: Deferred Musculoskeletal: Normal range of motion.     Right lower leg: No edema.     Left lower leg: No edema.  Skin:    General: Skin is warm and dry.     Coloration: Skin is not pale.     Findings: No erythema.     Comments: Multiple tattoos  Neurological:  General: No focal deficit present.     Mental Status: He is alert and oriented to person, place, and time.  Psychiatric:        Mood and Affect: Mood normal.        Behavior: Behavior normal.      Labs:  CBC Latest Ref Rng & Units 06/02/2019 06/02/2019 05/21/2019  WBC 4.0 - 10.5 K/uL 7.2 8.2 6.0  Hemoglobin 13.0 - 17.0 g/dL 16.1 09.6 04.5  Hematocrit 39.0 - 52.0 % 43.2 45.0 48.4  Platelets 150 - 400 K/uL 271 289 214   CMP Latest Ref Rng & Units 06/02/2019 06/02/2019 05/21/2019  Glucose 70 - 99 mg/dL 93 409(W) 99  BUN 6 - 20 mg/dL 8 10 15   Creatinine 0.61 - 1.24 mg/dL 1.19 1.47  Sodium 135 - 145 mmol/L 140 138 137  Potassium 3.5 - 5.1 mmol/L 3.6 3.9 4.1  Chloride 98 - 111 mmol/L 105 103 102  CO2 22 - 32 mmol/L 27 27 28   Calcium 8.9 - 10.3 mg/dL 8.9 9.1 8.9  Total Protein 6.5 - 8.1 g/dL - 7.3 6.6  Total Bilirubin 0.3 - 1.2 mg/dL - 1.0 1.2  Alkaline Phos 38 - 126 U/L - 56 53  AST 15 - 41 U/L - 18 13(L)  ALT 0 - 44 U/L - 11 15     Imaging studies:   KUB (06/02/2019 - 0600) personally reviewed, metallic foreign body still seen, no pneumoperitoneum, and radiologist report reviewed below:  IMPRESSION: Metallic radiopaque foreign body noted in the upper central abdomen, likely in the body or antral region of the stomach.  No findings for obstruction or  perforation.  KUB (06/02/2019 - 0200) personally reviewed with metallic foreign body seen, no perforation, and radiologist report reviewed below:  IMPRESSION: 1.1 cm metallic foreign body seen within the left upper quadrant, likely within small bowel loop. Nonobstructive bowel gas pattern. No Pneumoperitoneum.    Assessment/Plan: (ICD-10's: X78.XXA) 33 y.o. male with swallowed foreign body (razor blade), complicated by pertinent comorbidities including depression/self harm and extensive history of similar presentations.   - Appreciate medicine admission  - Continue NPO for now  - Serial abdominal examination + KUBs  - No emergent surgical intervention; he has passed these in the past on multiple occassions. We will closely monitor his examination. He understands if he acutely worsens and perforated his intestines he will require emergent intervention  - Agree with psychiatry consult  - Further management per primary service  All of the above findings and recommendations were discussed with the patient, and all of patient's questions were answered to his expressed satisfaction.  Thank you for the opportunity to participate in this patient's care.   -- 14/08/2018, PA-C [facility] 06/02/2019, 7:35 AM [phone] M-F: 7am - 4pm  I saw and evaluated the patient.  I agree with the above documentation, exam, and plan, which I have edited where appropriate. 14/08/2018  4:01 PM

## 2019-06-02 NOTE — ED Notes (Signed)
RN made aware from officers in room that pt trying to swallow objects in room, when RN entered room pt was holding a cap to a saline flush, pt threw cap on the floor when asked for cap. Area cleaned up around pt, all items in room out of reach. Pt remains handcuffed to bed with police.

## 2019-06-02 NOTE — ED Notes (Signed)
Pt requesting pain medication. RN called Pharmacy to update.

## 2019-06-02 NOTE — ED Notes (Signed)
Pt upset that not given IV pain medication. Pt ripped out his IV, refusing IV fluids and stating he is going to refuse care because he does not want to be here.

## 2019-06-02 NOTE — ED Provider Notes (Addendum)
[facility] Emergency Department Provider Note   ____________________________________________   I have reviewed the triage vital signs and the nursing notes.   HISTORY  Chief Complaint Suicide Attempt   History limited by: Not Limited   HPI Christopher Stephenson. is a 33 y.o. male who presents to the emergency department today from prison after ingesting a razor blade.  He states this happened roughly 2 hours prior to my evaluation.  The patient initially tried cutting his left forearm with a razor blades but states once the guard started coming he decided to swallow them.  The patient says that he has felt depressed and has wanted to hurt himself since he was 33 years old.  He is complaining of some discomfort in his abdomen describes it as a vague itchy sensation.  Records reviewed. Per medical record review patient has a history of multiple episodes of ingesting foreign bodies, self harm.   No past medical history on file.  Patient Active Problem List   Diagnosis Date Noted  . Major depressive disorder, recurrent episode, moderate (HCC) 05/09/2019  . Intentional self-harm by razor blade (HCC)   . Foreign body aspiration, sequela   . Ingestion of foreign body 05/07/2019    No past surgical history on file.  Prior to Admission medications   Medication Sig Start Date End Date Taking? Authorizing Provider  docusate sodium (COLACE) 100 MG capsule Take 2 capsules (200 mg total) by mouth 2 (two) times daily. 05/22/19   Alwyn Ren, MD  prazosin (MINIPRESS) 2 MG capsule Take 2 capsules (4 mg total) by mouth at bedtime. 05/22/19   Alwyn Ren, MD  psyllium (HYDROCIL/METAMUCIL) 95 % PACK Take 1 packet by mouth 3 (three) times daily. 05/22/19   Alwyn Ren, MD  sertraline (ZOLOFT) 25 MG tablet Take 1 tablet (25 mg total) by mouth daily. 05/22/19 05/21/20  Alwyn Ren, MD    Allergies Penicillins  No family history on  file.  Social History Social History   Tobacco Use  . Smoking status: Former Smoker    Packs/day: 0.50    Types: Cigarettes  . Smokeless tobacco: Never Used  Substance Use Topics  . Alcohol use: Not Currently  . Drug use: Not Currently    Review of Systems Constitutional: No fever/chills Eyes: No visual changes. ENT: No sore throat. Cardiovascular: Denies chest pain. Respiratory: Denies shortness of breath. Gastrointestinal: Positive for abdominal discomfort.  Genitourinary: Negative for dysuria. Musculoskeletal: Negative for back pain. Skin: Positive for cuts to his left forearm.  Neurological: Negative for headaches, focal weakness or numbness.  ____________________________________________   PHYSICAL EXAM:  VITAL SIGNS: ED Triage Vitals  Enc Vitals Group     BP 06/02/19 0145 (!) 155/88     Pulse Rate 06/02/19 0145 79     Resp 06/02/19 0145 18     Temp 06/02/19 0145 98.9 F (37.2 C)     Temp Source 06/02/19 0145 Oral     SpO2 06/02/19 0145 97 %     Weight 06/02/19 0147 160 lb (72.6 kg)     Height 06/02/19 0147 5\' 9"  (1.753 m)     Head Circumference --      Peak Flow --      Pain Score 06/02/19 0153 6   Constitutional: Alert and oriented.  Eyes: Conjunctivae are normal.  ENT      Head: Normocephalic and atraumatic.      Nose: No congestion/rhinnorhea.      Mouth/Throat:  Mucous membranes are moist.      Neck: No stridor. Hematological/Lymphatic/Immunilogical: No cervical lymphadenopathy. Cardiovascular: Normal rate, regular rhythm.  No murmurs, rubs, or gallops.  Respiratory: Normal respiratory effort without tachypnea nor retractions. Breath sounds are clear and equal bilaterally. No wheezes/rales/rhonchi. Gastrointestinal: Soft and non tender. No rebound. No guarding.  Genitourinary: Deferred Musculoskeletal: Normal range of motion in all extremities. No lower extremity edema. Neurologic:  Normal speech and language. No gross focal neurologic deficits are  appreciated.  Skin:  Superficial lacerations to the left forearm.  Psychiatric: Mood and affect are normal. Speech and behavior are normal. Patient exhibits appropriate insight and judgment.  ____________________________________________    LABS (pertinent positives/negatives)  Ethanol, acetaminophen, salicylate below threshold CMP wnl except glu 102 CBC wbc 8.2, hgb 16.2, plt 289  ____________________________________________   EKG  None  ____________________________________________    RADIOLOGY  X-ray abd w chest FB in left upper quadrant, thought likely to be in small bowel  ____________________________________________   PROCEDURES  Procedures  ____________________________________________   INITIAL IMPRESSION / ASSESSMENT AND PLAN / ED COURSE  Pertinent labs & imaging results that were available during my care of the patient were reviewed by me and considered in my medical decision making (see chart for details).   Patient presented to the ed from jail because of concern for ingested razor blade. The patient has history of ingesting FBs in the past. On exam abdomen is soft. X-ray shows FB in small bowel. Discussed with GI on call and surgery on call. No acute intervention needed at this time. Will plan on admission for repeat exams and x-rays.   430 am Patient states abdominal pain is increasing. On repeat exam abdomen continues to be soft. No rebound or guarding. Slightly diffusely tender to palpation. Will try other pain medication. I doubt perforation at this time.   ____________________________________________   FINAL CLINICAL IMPRESSION(S) / ED DIAGNOSES  Final diagnoses:  Intentional self-harm by razor blade ([place])  Swallowed foreign body, initial encounter     Note: This dictation was prepared with Dragon dictation. Any transcriptional errors that result from this process are unintentional     Nance Pear, MD 06/02/19 [place],  [place], MD 06/02/19 [phone]

## 2019-06-03 ENCOUNTER — Inpatient Hospital Stay

## 2019-06-03 MED ORDER — MORPHINE SULFATE 15 MG PO TABS
22.5000 mg | ORAL_TABLET | ORAL | Status: DC | PRN
  Administered 2019-06-03 – 2019-06-08 (×22): 22.5 mg via ORAL
  Filled 2019-06-03 (×24): qty 2

## 2019-06-03 MED ORDER — MORPHINE SULFATE (PF) 2 MG/ML IV SOLN
2.0000 mg | INTRAVENOUS | Status: DC | PRN
  Administered 2019-06-03 – 2019-06-04 (×2): 2 mg via INTRAVENOUS
  Filled 2019-06-03 (×2): qty 1

## 2019-06-03 MED ORDER — MORPHINE SULFATE (PF) 2 MG/ML IV SOLN
INTRAVENOUS | Status: AC
  Administered 2019-06-03: 17:00:00 2 mg via INTRAVENOUS
  Filled 2019-06-03: qty 1

## 2019-06-03 MED ORDER — MORPHINE SULFATE (PF) 2 MG/ML IV SOLN
2.0000 mg | Freq: Once | INTRAVENOUS | Status: AC
  Administered 2019-06-03: 17:00:00 2 mg via INTRAVENOUS

## 2019-06-03 NOTE — ED Notes (Signed)
Pt resting in bed with no acute distress noted. Abdomen remains soft,  nondistended with active bowel sounds heard.

## 2019-06-03 NOTE — ED Notes (Signed)
Pt ambulatory to bathroom escorted by police officers.

## 2019-06-03 NOTE — ED Notes (Signed)
Patient reminded that he is on a clear liquid diet and cannot have food to eat, only fluids and jello

## 2019-06-03 NOTE — ED Notes (Signed)
Attending and Surgeon consulting case notified of pt having BM. Pt states he noted blood in toilet prior to passing stool. Pt states now having pain that is unbearable. MD on call to follow up with pt.

## 2019-06-03 NOTE — Progress Notes (Addendum)
PROGRESS NOTE    Christopher Stephenson.  OBS:[id number] DOB: 09-21-1985 DOA: 06/02/2019 PCP: Patient, No Pcp Per      Brief Narrative:  Christopher Stephenson is a 33 y.o. M with hx depression and HTN and PTSD presented with intentional ingestion of a razor blade.  Patient is in police custody, obtained a razor blade and attempted to slit his wrists.  He saw a guard coming, and so he swallowed the razor blade.  In the ER, radiograph confirmed a radioopaque object superimposed over the small bowel.  He had abdominal pain.  Psych were consulted who recommended no psychiatric treatment and stated "Patient is not suicidal, homicidal or psychotic and does not need inpatient psychiatric care."    GI and General Surgery were consulted who recommended serial abdominal exams.        Assessment & Plan:  Suicide attempt by razor blade No change overnight.  Exam stable.  Xray this morning shows razor blade, appeasr to be in ascending colon, now broken in 2 pieces.  No free air. -I recommend he be NPO with sips with meds -Continue MIVF -Serial abdominal exams -Serial abd x-rays -Consult General Surgery -Consult Psychiatry -Morphine PO, increase dose -Continue docusate, Psyllium   PTSD -Continue prazosin, sertraline    DVT prophylaxis: SCDs Code Status: FUL Family Communication:    MDM and disposition Plan:  The below labs and imaging reports reviewed and summarized above.  Medication management as above.   The patient was admitted with ingestion of razor blade. General Surgery expect this to pass.  If he develops perforation, will need surgery.         Objective: Vitals:   06/02/19 2347 06/02/19 2348 06/02/19 2348 06/03/19 0314  BP: 123/68  123/68 106/68  Pulse:  97 89 77  Resp:    16  Temp:      TempSrc:      SpO2:  95% 96% 100%  Weight:      Height:        Intake/Output Summary (Last 24 hours) at 06/03/2019 0950 Last data filed at 06/02/2019 2252 Gross per 24 hour   Intake 1100 ml  Output -  Net 1100 ml   Filed Weights   06/02/19 0147 06/02/19 0154  Weight: 72.6 kg 74.8 kg    Examination: General appearance:  adult male, alert and in moderate distress from pain.   HEENT: Anicteric, conjunctiva pink, lids and lashes normal. No nasal deformity, discharge, epistaxis.  Lips moist, teeth normal. OP normal, no oral lesions.   Skin: Warm and dry.  No suspicious rashes or lesions. Cardiac: RRR, no murmurs appreciated.  No LE edema.    Respiratory: Normal respiratory rate and rhythm.  CTAB without rales or wheezes. Abdomen: Abdomen soft.  Moderate, primarily epigastric TTP, voluntary guarding noted. No ascites, distension, hepatosplenomegaly.   MSK: No deformities or effusions of the large joints of the upper or lower extremities bilaterally. Neuro: Awake and alert. Naming is grossly intact, and the patient's recall, recent and remote, as well as general fund of knowledge seem within normal limits.  Muscle tone normal, without fasciculations.  Moves all extremities equally and with normal coordination.  Marland Kitchen Speech fluent.    Psych: Sensorium intact and responding to questions, attention normal. Affect normal.  Judgment and insight appear normal.     Data Reviewed: I have personally reviewed following labs and imaging studies:  CBC: Recent Labs  Lab 06/02/19 0153 06/02/19 0543  WBC 8.2 7.2  HGB 16.2 15.4  HCT 45.0 43.2  MCV 84.6 84.5  PLT 289 271   Basic Metabolic Panel: Recent Labs  Lab 06/02/19 0153 06/02/19 0543  NA 138 140  K 3.9 3.6  CL 103 105  CO2 27 27  GLUCOSE 102* 93  BUN 10 8  CREATININE 0.87 0.83  CALCIUM 9.1 8.9   GFR: Estimated Creatinine Clearance: 126.6 mL/min (by C-G formula based on SCr of 0.83 mg/dL). Liver Function Tests: Recent Labs  Lab 06/02/19 0153  AST 18  ALT 11  ALKPHOS 56  BILITOT 1.0  PROT 7.3  ALBUMIN 4.5   No results for input(s): LIPASE, AMYLASE in the last 168 hours. No results for input(s):  AMMONIA in the last 168 hours. Coagulation Profile: No results for input(s): INR, PROTIME in the last 168 hours. Cardiac Enzymes: No results for input(s): CKTOTAL, CKMB, CKMBINDEX, TROPONINI in the last 168 hours. BNP (last 3 results) No results for input(s): PROBNP in the last 8760 hours. HbA1C: No results for input(s): HGBA1C in the last 72 hours. CBG: No results for input(s): GLUCAP in the last 168 hours. Lipid Profile: No results for input(s): CHOL, HDL, LDLCALC, TRIG, CHOLHDL, LDLDIRECT in the last 72 hours. Thyroid Function Tests: No results for input(s): TSH, T4TOTAL, FREET4, T3FREE, THYROIDAB in the last 72 hours. Anemia Panel: No results for input(s): VITAMINB12, FOLATE, FERRITIN, TIBC, IRON, RETICCTPCT in the last 72 hours. Urine analysis: No results found for: COLORURINE, APPEARANCEUR, LABSPEC, PHURINE, GLUCOSEU, HGBUR, BILIRUBINUR, KETONESUR, PROTEINUR, UROBILINOGEN, NITRITE, LEUKOCYTESUR Sepsis Labs: @LABRCNTIP (procalcitonin:4,lacticacidven:4)  ) Recent Results (from the past 240 hour(s))  SARS CORONAVIRUS 2 (TAT 6-24 HRS) Nasopharyngeal Nasopharyngeal Swab     Status: None   Collection Time: 06/02/19  5:43 AM   Specimen: Nasopharyngeal Swab  Result Value Ref Range Status   SARS Coronavirus 2 NEGATIVE NEGATIVE Final    Comment: (NOTE) SARS-CoV-2 target nucleic acids are NOT DETECTED. The SARS-CoV-2 RNA is generally detectable in upper and lower respiratory specimens during the acute phase of infection. Negative results do not preclude SARS-CoV-2 infection, do not rule out co-infections with other pathogens, and should not be used as the sole basis for treatment or other patient management decisions. Negative results must be combined with clinical observations, patient history, and epidemiological information. The expected result is Negative. Fact Sheet for Patients: HairSlick.nohttps://www.fda.gov/media/138098/download Fact Sheet for Healthcare Providers:  quierodirigir.comhttps://www.fda.gov/media/138095/download This test is not yet approved or cleared by the [place] FDA and  has been authorized for detection and/or diagnosis of SARS-CoV-2 by FDA under an Emergency Use Authorization (EUA). This EUA will remain  in effect (meaning this test can be used) for the duration of the COVID-19 declaration under Section 56 4(b)(1) of the Act, 21 U.S.C. section 360bbb-3(b)(1), unless the authorization is terminated or revoked sooner. Performed at [facility], [address] [place], [place] [address]          Radiology Studies: Dg Abd 2 Views  Result Date: 06/03/2019 CLINICAL DATA:  Ingestion of foreign body EXAM: ABDOMEN - 2 VIEW COMPARISON:  06/02/2019 FINDINGS: There are two radiopaque foreign bodies identified, both likely within the right colon. Bowel gas pattern is unremarkable. IMPRESSION: Two radiopaque foreign bodies likely within the right colon. Electronically Signed   By: Guadlupe SpanishPraneil  Patel M.D.   On: 06/03/2019 08:21   Dg Abd 2 Views  Result Date: 06/02/2019 CLINICAL DATA:  Patient swallowed a razor blade. EXAM: ABDOMEN - 2 VIEW COMPARISON:  Earlier film, same date. FINDINGS: Radiopaque foreign body is again demonstrated  in the upper abdomen, likely in the antral region of the stomach. The lung bases are clear. Bowel gas pattern is unremarkable. Moderate stool throughout the colon. No free air. IMPRESSION: Metallic radiopaque foreign body noted in the upper central abdomen, likely in the body or antral region of the stomach. No findings for obstruction or perforation. Electronically Signed   By: Marijo Sanes M.D.   On: 06/02/2019 06:04   Dg Abd Acute W/chest  Result Date: 06/02/2019 CLINICAL DATA:  Patient swallowed razor blade EXAM: DG ABDOMEN ACUTE W/ 1V CHEST COMPARISON:  None. FINDINGS: There is no evidence of dilated bowel loops or free intraperitoneal air. Within the left upper quadrant, likely within a small bowel loop there is a 1.1  cm metallic foreign body. Air and stool seen in nondilated loops of bowel. The lungs are clear. No focal airspace consolidation or pleural effusion. IMPRESSION: 1.1 cm metallic foreign body seen within the left upper quadrant, likely within small bowel loop. Nonobstructive bowel gas pattern. No pneumoperitoneum. No acute cardiopulmonary process Electronically Signed   By: Prudencio Pair M.D.   On: 06/02/2019 02:27        Scheduled Meds: . acetaminophen  1,000 mg Oral TID  . docusate sodium  200 mg Oral BID  . prazosin  4 mg Oral QHS  . psyllium  1 packet Oral TID  . sertraline  25 mg Oral Daily   Continuous Infusions: . dextrose 5 % and 0.9% NaCl 125 mL/hr at 06/03/19 0509     LOS: 1 day    Time spent: 25 minutes    Edwin Dada, MD [facility] 06/03/2019, 9:50 AM     Please page though [place] or Epic secure chat:  For password, contact charge nurse

## 2019-06-03 NOTE — ED Notes (Signed)
Patient asleep at present, no complaints. [employer] at bedside

## 2019-06-03 NOTE — ED Notes (Signed)
Officers at bedside, patient more cooperative, states he will let RN place another IV for fluids because he "doesn't want to get dehydrated". Refused trazadone

## 2019-06-03 NOTE — ED Notes (Signed)
Pt given food tray. Appetite good.

## 2019-06-03 NOTE — Progress Notes (Addendum)
[facility] SURGICAL PROGRESS NOTE (cpt [phone])  Hospital Day(s): 1.    Interval History: Patient seen and examined, no acute events or new complaints overnight. Patient reports abdominal soreness, improved from prior. No fever ,chills, nausea, or emesis. He is noting some back pain. No other complaints. Abdominal XR shows metallic foreign body in the colon, no pneumoperitoneum  Review of Systems:  Constitutional: denies fever, chills  HEENT: denies cough or congestion  Respiratory: denies any shortness of breath  Cardiovascular: denies chest pain or palpitations  Gastrointestinal: + abdominal pain (soreness - improved), denied N/V, or diarrhea/and bowel function as per interval history Genitourinary: denies burning with urination or urinary frequency Musculoskeletal: + back pain  Vital signs in last 24 hours: [min-max] current  Pulse Rate:  [72-97] 77 (12/04 0314) Resp:  [16-18] 16 (12/04 0314) BP: (106-142)/(68-85) 106/68 (12/04 0314) SpO2:  [95 %-100 %] 100 % (12/04 0314)     Height: 5\' 9"  (175.3 cm) Weight: 74.8 kg BMI (Calculated): 24.36   Intake/Output last 2 shifts:  12/03 0701 - 12/04 0700 In: 1100 [I.V.:1100] Out: -    Physical Exam:  Constitutional: alert, cooperative and no distress, officers at bedside  HENT: normocephalic without obvious abnormality  Eyes: PERRL, EOM's grossly intact and symmetric  Respiratory: breathing non-labored at rest  Cardiovascular: regular rate and sinus rhythm  Gastrointestinal: soft, non-tender, and non-distended, no rebound/guarding, no evidence of peritonitis Musculoskeletal: no edema or wounds, motor and sensation grossly intact, NT, extremities handcuffed to bed    Labs:  CBC Latest Ref Rng & Units 06/02/2019 06/02/2019 05/21/2019  WBC 4.0 - 10.5 K/uL 7.2 8.2 6.0  Hemoglobin 13.0 - 17.0 g/dL 15.4 16.2 15.8  Hematocrit 39.0 - 52.0 % 43.2 45.0 48.4  Platelets 150 - 400 K/uL 271 289 214   CMP Latest Ref Rng & Units  06/02/2019 06/02/2019 05/21/2019  Glucose 70 - 99 mg/dL 93 102(H) 99  BUN 6 - 20 mg/dL 8 10 15   Creatinine 0.61 - 1.24 mg/dL 0.83 0.87 0.88  Sodium 135 - 145 mmol/L 140 138 137  Potassium 3.5 - 5.1 mmol/L 3.6 3.9 4.1  Chloride 98 - 111 mmol/L 105 103 102  CO2 22 - 32 mmol/L 27 27 28   Calcium 8.9 - 10.3 mg/dL 8.9 9.1 8.9  Total Protein 6.5 - 8.1 g/dL - 7.3 6.6  Total Bilirubin 0.3 - 1.2 mg/dL - 1.0 1.2  Alkaline Phos 38 - 126 U/L - 56 53  AST 15 - 41 U/L - 18 13(L)  ALT 0 - 44 U/L - 11 15     Imaging studies:   Abdominal XR (06/03/2019) personally reviewed showing metallic foreign body progression to right colon, no pneumoperitoneum, and radiologist report reviewed: IMPRESSION: Two radiopaque foreign bodies likely within the right colon.  Assessment/Plan: (ICD-10's: X78.XXA) 33 y.o. male with swallowed foreign body (razor blade), complicated by pertinent comorbidities including depression/self harm and extensive history of similar presentations.   - Advance diet as tolerated; okay for regular diet             - Serial abdominal examination + KUBs  - Wait for him to pass foreign bodies             - No emergent surgical intervention; he has passed these in the past on multiple occassions. We will closely monitor his examination. He understands if he acutely worsens and perforated his intestines he will require emergent intervention             -  Agree with psychiatry consult             - Further management per primary service  All of the above findings and recommendations were discussed with the patient, and all of patient's questions were answered to his expressed satisfaction.  Thank you for the opportunity to participate in this patient's care.   -- Lynden Oxford, PA-C [facility] 06/03/2019, 7:34 AM [phone] M-F: 7am - 4pm   I saw and evaluated the patient.  I agree with the above documentation, exam, and plan, which I have edited where  appropriate. Duanne Guess  1:59 PM

## 2019-06-03 NOTE — ED Notes (Signed)
Given cup of water, drank half, threw rest away

## 2019-06-03 NOTE — Progress Notes (Addendum)
Notified on call MD patient in very bad pain. Next dose of PRN MSO4 not due until 0030. Order placed to give dose early. Contacted pharmacy to request dose.   2335- dose arrived from pharmacy. Patient crying and writhing in pain on stretcher. Patient requesting medication that will work quickly via IV. He states the PO medication will take 45 mins and he can't take it. Paged on call provider again to make this request.   2356- Received Prn morphine IV order from dr. Sidney Ace

## 2019-06-03 NOTE — ED Notes (Signed)
Pt states that he passed " a lot of gas ". No BM at this time.

## 2019-06-04 ENCOUNTER — Inpatient Hospital Stay

## 2019-06-04 MED ORDER — SERTRALINE HCL 50 MG PO TABS
50.0000 mg | ORAL_TABLET | Freq: Every day | ORAL | Status: DC
  Administered 2019-06-05 – 2019-06-08 (×4): 50 mg via ORAL
  Filled 2019-06-04 (×4): qty 1

## 2019-06-04 MED ORDER — POLYETHYLENE GLYCOL 3350 17 G PO PACK
17.0000 g | PACK | Freq: Every day | ORAL | Status: DC
  Administered 2019-06-05 – 2019-06-08 (×4): 17 g via ORAL
  Filled 2019-06-04 (×4): qty 1

## 2019-06-04 MED ORDER — IBUPROFEN 600 MG PO TABS
600.0000 mg | ORAL_TABLET | Freq: Three times a day (TID) | ORAL | Status: DC | PRN
  Administered 2019-06-05: 11:00:00 600 mg via ORAL
  Filled 2019-06-04 (×2): qty 1

## 2019-06-04 MED ORDER — ACETAMINOPHEN 500 MG PO TABS
1000.0000 mg | ORAL_TABLET | Freq: Four times a day (QID) | ORAL | Status: DC | PRN
  Administered 2019-06-05 – 2019-06-08 (×5): 1000 mg via ORAL
  Filled 2019-06-04 (×6): qty 2

## 2019-06-04 NOTE — ED Notes (Signed)
Patient given lunch tray.

## 2019-06-04 NOTE — ED Notes (Signed)
Patient refusing metamucil packet at this time

## 2019-06-04 NOTE — Progress Notes (Signed)
PROGRESS NOTE    [place].  ZOX:[id number] DOB: 1985-10-24 DOA: 06/02/2019 PCP: Patient, No Pcp Per      Brief Narrative:  Mr. Rivadeneira is a 33 y.o. M with hx depression and HTN and PTSD presented with intentional ingestion of a razor blade.  Patient is in police custody, obtained a razor blade and attempted to slit his wrists.  He saw a guard coming, and so he swallowed the razor blade.  In the ER, radiograph confirmed a radioopaque object superimposed over the small bowel.  He had abdominal pain.  Psych were consulted who recommended no psychiatric treatment and stated "Patient is not suicidal, homicidal or psychotic and does not need inpatient psychiatric care."    GI and General Surgery were consulted who recommended serial abdominal exams.        Assessment & Plan:  Suicide attempt by razor blade Patient has continued pain.  Passed some red blood yesterday afternoon, but I did not witness this, and two follow up x-rays showed no free air.  This AM, exam unchanged.  X-ray shows larger fragment now by splenic flexure.  Smaller one still in ascending colon.    -I recommend he be NPO with sips with meds -Continue serial abdominal exams -Continue daily serial abd x-rays -Consult General Surgery -Consult Psychiatry -Continue morphine, PO, have increased dose, patient able to tolearte PO without vomiting -Continue docusate, Psyllium   PTSD -Continue prazosin, sertraline    DVT prophylaxis: SCDs Code Status: FULL Family Communication:    MDM and disposition Plan:  The below labs and imaging reports reviewed and summarized above.  Medication management as above.   The patient was admitted with ingestion of razor blade. General Surgery expect this to pass.  If he develops perforation, will need surgery.         Objective: Vitals:   06/03/19 2041 06/04/19 0043 06/04/19 0521 06/04/19 0752  BP: 121/74 118/88 112/77 (!) 102/55  Pulse: 77  77 79  Resp:  18  18 16   Temp: (!) 97.5 F (36.4 C)  97.6 F (36.4 C) 97.7 F (36.5 C)  TempSrc: Oral  Axillary Oral  SpO2: 100%  97% 94%  Weight:      Height:       No intake or output data in the 24 hours ending 06/04/19 0948 Filed Weights   06/02/19 0147 06/02/19 0154  Weight: 72.6 kg 74.8 kg    Examination: General appearance:  adult male, alert and in discomfort, no distress.   HEENT: Anicteric, conjunctiva pink, lids and lashes normal. No nasal deformity, discharge, epistaxis.  Lips moist, teeth normal. OP normal, no oral lesions.   Skin: Warm and dry.  No suspicious rashes or lesions. Cardiac: RRR, no murmurs appreciated.  No LE edema.    Respiratory: Normal respiratory rate and rhythm.  CTAB without rales or wheezes. Abdomen: Abdomen soft.  Voluntary guarding, tenderness suprapubically, no rigidity or rebound. No ascites, distension, hepatosplenomegaly.   MSK: No deformities or effusions of the large joints of the upper or lower extremities bilaterally. Neuro: Awake and alert. Naming is grossly intact, and the patient's recall, recent and remote, as well as general fund of knowledge seem within normal limits.  Muscle tone normal, without fasciculations.  Moves all extremities equally and with normal coordination.  Marland Kitchen Speech fluent.    Psych: Sensorium intact and responding to questions, attention normal. Affect normal.  Judgment and insight appear normal.       Data Reviewed: I have  personally reviewed following labs and imaging studies:  CBC: Recent Labs  Lab 06/02/19 0153 06/02/19 0543  WBC 8.2 7.2  HGB 16.2 15.4  HCT 45.0 43.2  MCV 84.6 84.5  PLT 289 271   Basic Metabolic Panel: Recent Labs  Lab 06/02/19 0153 06/02/19 0543  NA 138 140  K 3.9 3.6  CL 103 105  CO2 27 27  GLUCOSE 102* 93  BUN 10 8  CREATININE 0.87 0.83  CALCIUM 9.1 8.9   GFR: Estimated Creatinine Clearance: 126.6 mL/min (by C-G formula based on SCr of 0.83 mg/dL). Liver Function Tests: Recent Labs   Lab 06/02/19 0153  AST 18  ALT 11  ALKPHOS 56  BILITOT 1.0  PROT 7.3  ALBUMIN 4.5   No results for input(s): LIPASE, AMYLASE in the last 168 hours. No results for input(s): AMMONIA in the last 168 hours. Coagulation Profile: No results for input(s): INR, PROTIME in the last 168 hours. Cardiac Enzymes: No results for input(s): CKTOTAL, CKMB, CKMBINDEX, TROPONINI in the last 168 hours. BNP (last 3 results) No results for input(s): PROBNP in the last 8760 hours. HbA1C: No results for input(s): HGBA1C in the last 72 hours. CBG: No results for input(s): GLUCAP in the last 168 hours. Lipid Profile: No results for input(s): CHOL, HDL, LDLCALC, TRIG, CHOLHDL, LDLDIRECT in the last 72 hours. Thyroid Function Tests: No results for input(s): TSH, T4TOTAL, FREET4, T3FREE, THYROIDAB in the last 72 hours. Anemia Panel: No results for input(s): VITAMINB12, FOLATE, FERRITIN, TIBC, IRON, RETICCTPCT in the last 72 hours. Urine analysis: No results found for: COLORURINE, APPEARANCEUR, LABSPEC, PHURINE, GLUCOSEU, HGBUR, BILIRUBINUR, KETONESUR, PROTEINUR, UROBILINOGEN, NITRITE, LEUKOCYTESUR Sepsis Labs: @LABRCNTIP (procalcitonin:4,lacticacidven:4)  ) Recent Results (from the past 240 hour(s))  SARS CORONAVIRUS 2 (TAT 6-24 HRS) Nasopharyngeal Nasopharyngeal Swab     Status: None   Collection Time: 06/02/19  5:43 AM   Specimen: Nasopharyngeal Swab  Result Value Ref Range Status   SARS Coronavirus 2 NEGATIVE NEGATIVE Final    Comment: (NOTE) SARS-CoV-2 target nucleic acids are NOT DETECTED. The SARS-CoV-2 RNA is generally detectable in upper and lower respiratory specimens during the acute phase of infection. Negative results do not preclude SARS-CoV-2 infection, do not rule out co-infections with other pathogens, and should not be used as the sole basis for treatment or other patient management decisions. Negative results must be combined with clinical observations, patient history, and  epidemiological information. The expected result is Negative. Fact Sheet for Patients: HairSlick.nohttps://www.fda.gov/media/138098/download Fact Sheet for Healthcare Providers: quierodirigir.comhttps://www.fda.gov/media/138095/download This test is not yet approved or cleared by the [place] FDA and  has been authorized for detection and/or diagnosis of SARS-CoV-2 by FDA under an Emergency Use Authorization (EUA). This EUA will remain  in effect (meaning this test can be used) for the duration of the COVID-19 declaration under Section 56 4(b)(1) of the Act, 21 U.S.C. section 360bbb-3(b)(1), unless the authorization is terminated or revoked sooner. Performed at [facility], [address] [place], [place] [address]          Radiology Studies: Dg Abd 1 View  Result Date: 06/03/2019 CLINICAL DATA:  Severe abdominal pain swallowed 2 razor blades EXAM: ABDOMEN - 1 VIEW COMPARISON:  06/03/2019 FINDINGS: Nonobstructed gas pattern. Rectangular foreign bodies in the right lower and right upper quadrant, likely overlying ascending colon and proximal transverse colon. Some migration of the distal foreign body, not much change in appearance/position of the right lower quadrant foreign body. IMPRESSION: 1. Nonobstructed gas pattern 2. Two rectangular  metallic foreign bodies project over the right colon and proximal transverse colon. Electronically Signed   By: Jasmine Pang M.D.   On: 06/03/2019 17:02   Dg Abd 2 Views  Result Date: 06/03/2019 CLINICAL DATA:  Ingestion of foreign body EXAM: ABDOMEN - 2 VIEW COMPARISON:  06/02/2019 FINDINGS: There are two radiopaque foreign bodies identified, both likely within the right colon. Bowel gas pattern is unremarkable. IMPRESSION: Two radiopaque foreign bodies likely within the right colon. Electronically Signed   By: Guadlupe Spanish M.D.   On: 06/03/2019 08:21        Scheduled Meds: . acetaminophen  1,000 mg Oral TID  . docusate sodium  200 mg Oral BID  .  polyethylene glycol  17 g Oral Daily  . prazosin  4 mg Oral QHS  . psyllium  1 packet Oral TID  . sertraline  25 mg Oral Daily   Continuous Infusions:    LOS: 2 days    Time spent: 25 minutes    Alberteen Sam, MD [facility] 06/04/2019, 9:48 AM     Please page though AMION or Epic secure chat:  For password, contact charge nurse

## 2019-06-04 NOTE — ED Notes (Signed)
NP with psychiatry at bedside at this time

## 2019-06-04 NOTE — Consult Note (Signed)
I was asked to speak with patient about his complaint of leg pain due to taking prazosin.  I cannot find any documentation anywhere that prazosin specifically causes leg pain.  After speaking with the patient he reports that it has happened with almost any medication that he takes at bedtime including trazodone and Vistaril.  He states that he wants to continue the prazosin for his night terrors though.  He is informed that it would only be a medication to assist with his leg pain that would be allowed [facility] the jail.  Patient states that he has been told that he has restless leg syndrome and that he would just take some ibuprofen before he goes to bed to assist with this.  Patient does request for his Zoloft to be increased some since he has been tolerating it well to assist with his depression and I have increased his Zoloft to 50 mg p.o. daily.  Patient is already been psychiatrically cleared for inpatient treatment.

## 2019-06-04 NOTE — Progress Notes (Addendum)
Patient called RN to bathroom where he had bloody toilet paper in commode. Blood was bright red in minimal amount. Patient has not had a bowel movement at this time. Dr. Hampton Abbot messaged and oncoming RN aware.

## 2019-06-04 NOTE — ED Notes (Signed)
Patient given coke. 

## 2019-06-04 NOTE — Progress Notes (Signed)
06/04/2019  Subjective: No acute events overnight.  Yesterday afternoon he did have an episode of worsening abdominal pain in the left lower quadrant.  However repeat KUB did not show any free air and the razor that he had swallowed was still on the right side of the colon.  Today, the patient is more calm and apologized for his attitude yesterday.  He reports that his pain is better and thought that maybe could be cramping that was happening yesterday.  He has had 2 bowel movements yesterday as well as today.  KUB today shows the partial rates are still in the right lower quadrant in the 4 razor to be in the splenic flexure.  Vital signs: Temp:  [97.5 F (36.4 C)-98.4 F (36.9 C)] 97.7 F (36.5 C) (12/05 0752) Pulse Rate:  [77-89] 79 (12/05 0752) Resp:  [16-20] 16 (12/05 0752) BP: (102-121)/(55-92) 102/55 (12/05 0752) SpO2:  [94 %-100 %] 94 % (12/05 0752)   Intake/Output: No intake/output data recorded.    Physical Exam: Constitutional: No acute distress Abdomen: Soft, nondistended, currently nontender to palpation.  Labs:  Recent Labs    06/02/19 0153 06/02/19 0543  WBC 8.2 7.2  HGB 16.2 15.4  HCT 45.0 43.2  PLT 289 271   Recent Labs    06/02/19 0153 06/02/19 0543  NA 138 140  K 3.9 3.6  CL 103 105  CO2 27 27  GLUCOSE 102* 93  BUN 10 8  CREATININE 0.87 0.83  CALCIUM 9.1 8.9   No results for input(s): LABPROT, INR in the last 72 hours.  Imaging: Dg Abd 1 View  Result Date: 06/03/2019 CLINICAL DATA:  Severe abdominal pain swallowed 2 razor blades EXAM: ABDOMEN - 1 VIEW COMPARISON:  06/03/2019 FINDINGS: Nonobstructed gas pattern. Rectangular foreign bodies in the right lower and right upper quadrant, likely overlying ascending colon and proximal transverse colon. Some migration of the distal foreign body, not much change in appearance/position of the right lower quadrant foreign body. IMPRESSION: 1. Nonobstructed gas pattern 2. Two rectangular metallic foreign bodies  project over the right colon and proximal transverse colon. Electronically Signed   By: Donavan Foil M.D.   On: 06/03/2019 17:02    Assessment/Plan: This is a 33 y.o. male who swallowed two razors  -At this point there is no acute surgical issues and will continue following conservatively.  We will obtain repeat KUB tomorrow. -Patient reports that on his previous admission at [facility], the psychiatry team had started him on Zoloft and prazosin.  He reports that the prazosin is causing lower extremity pain he asked for another psychiatry consult here to help with medication management.  I have messaged Dr. Weber Cooks via epic.   Melvyn Neth, [facility]

## 2019-06-04 NOTE — ED Notes (Signed)
Patient given dinner tray.

## 2019-06-04 NOTE — Progress Notes (Signed)
Called in room by guard for IV alarming. Patient reports that his IV tubing is sticking in his arm and he wants it moved. Informed patient that IV was alarming due to the tubing being kinked. IV site is patent and working well with blood return. Patient requesting IV site be relocated. Notified patient that I was unable to visualize a better site. Patient argumentative regarding moving IV site. Will contact IV team.

## 2019-06-05 ENCOUNTER — Other Ambulatory Visit: Payer: Self-pay

## 2019-06-05 ENCOUNTER — Inpatient Hospital Stay

## 2019-06-05 MED ORDER — CHLORHEXIDINE GLUCONATE CLOTH 2 % EX PADS
6.0000 | MEDICATED_PAD | Freq: Every day | CUTANEOUS | Status: DC
  Administered 2019-06-06 – 2019-06-08 (×3): 6 via TOPICAL

## 2019-06-05 MED ORDER — MUPIROCIN 2 % EX OINT
1.0000 "application " | TOPICAL_OINTMENT | Freq: Two times a day (BID) | CUTANEOUS | Status: DC
  Administered 2019-06-07: 1 via NASAL
  Filled 2019-06-05: qty 22

## 2019-06-05 NOTE — ED Notes (Addendum)
Surgeon (Dr. Hampton Abbot) at bedside to assess pt.

## 2019-06-05 NOTE — ED Notes (Signed)
Pt taken to [place] at this time, remains in custody. 2 officers with pt.

## 2019-06-05 NOTE — ED Notes (Signed)
Pt sitting up eating breakfast.

## 2019-06-05 NOTE — Progress Notes (Signed)
PROGRESS NOTE    [place].  EGB:[id number] DOB: 05/30/1986 DOA: 06/02/2019 PCP: Patient, No Pcp Per      Brief Narrative:  Christopher Stephenson is a 33 y.o. M with hx depression and HTN and PTSD presented with intentional ingestion of a razor blade.  Patient is in police custody, obtained a razor blade and attempted to slit his wrists.  He saw a guard coming, and so he swallowed the razor blade.  In the ER, radiograph confirmed a radioopaque object superimposed over the small bowel.  He had abdominal pain.  Psych were consulted who recommended no psychiatric treatment and stated "Patient is not suicidal, homicidal or psychotic and does not need inpatient psychiatric care."    GI and General Surgery were consulted who recommended serial abdominal exams.        Assessment & Plan:  Suicide attempt by razor blade Pain is primarily in the low same location, low abdomen, low back as previously.  Again passed some bright red blood yesterday, but this was a very small amount and description by nursing, and is consistent with hemorrhoid pain.  Certainly he has no free air on his x-ray which was personally reviewed today and shows that the larger fragment of razor is moving along.     -I recommend he be NPO with sips with meds and IVF -Daily serial abdominal x-rays -Continue serial abdominal exams -Consult general surgery, appreciate recommendations   -Continue docusate, Psyllium, defer any other laxative to general surgery (I would not recommend a stimulant laxative)   PTSD -Continue prazosin, sertraline    DVT prophylaxis: SCDs Code Status: FULL Family Communication:    MDM and disposition Plan:  The below labs and imaging reports reviewed and summarized above.  Medication management as above.    The patient was admitted with ingestion of razor blade. General Surgery expect this to pass.  But there is still no passage of razor yet.  If he develops perforation, will need  surgery.         Objective: Vitals:   06/04/19 0752 06/04/19 1649 06/04/19 2312 06/05/19 1232  BP: (!) 102/55 129/78 (!) 146/83 124/80  Pulse: 79 66 70 74  Resp: 16 16 16 16   Temp: 97.7 F (36.5 C) 98.1 F (36.7 C)    TempSrc: Oral Oral    SpO2: 94% 98% 99% 98%  Weight:      Height:       No intake or output data in the 24 hours ending 06/05/19 1244 Filed Weights   06/02/19 0147 06/02/19 0154  Weight: 72.6 kg 74.8 kg    Examination: General appearance:  adult male, alert and in moderate distress from pain, lying in fetal position, hugging pillow.   HEENT: Anicteric, conjunctiva pink, lids and lashes normal. No nasal deformity, discharge, epistaxis.  Lips moist, teeth normal. OP normal, no oral lesions.   Skin: Warm and dry.  No suspicious rashes or lesions.  Tattoos present. Cardiac: RRR, no murmurs appreciated.  No LE edema.    Respiratory: Normal respiratory rate and rhythm.  CTAB without rales or wheezes. Abdomen: Abdomen soft.  Voluntary guarding.  No rigidity or rebound.  Tenderness mostly in lower abdomen. No ascites, distension, hepatosplenomegaly.   MSK: No deformities or effusions of the large joints of the upper or lower extremities bilaterally. Neuro: Awake and alert. Naming is grossly intact, and the patient's recall, recent and remote, as well as general fund of knowledge seem within normal limits.  Muscle tone  normal, without fasciculations.  Moves all extremities equally and with normal coordination.  Marland Kitchen. Speech fluent.    Psych: Sensorium intact and responding to questions, attention normal. Affect anxious.  Judgment and insight appear normal.         Data Reviewed: I have personally reviewed following labs and imaging studies:  CBC: Recent Labs  Lab 06/02/19 0153 06/02/19 0543  WBC 8.2 7.2  HGB 16.2 15.4  HCT 45.0 43.2  MCV 84.6 84.5  PLT 289 271   Basic Metabolic Panel: Recent Labs  Lab 06/02/19 0153 06/02/19 0543  NA 138 140  K 3.9 3.6   CL 103 105  CO2 27 27  GLUCOSE 102* 93  BUN 10 8  CREATININE 0.87 0.83  CALCIUM 9.1 8.9   GFR: Estimated Creatinine Clearance: 126.6 mL/min (by C-G formula based on SCr of 0.83 mg/dL). Liver Function Tests: Recent Labs  Lab 06/02/19 0153  AST 18  ALT 11  ALKPHOS 56  BILITOT 1.0  PROT 7.3  ALBUMIN 4.5   No results for input(s): LIPASE, AMYLASE in the last 168 hours. No results for input(s): AMMONIA in the last 168 hours. Coagulation Profile: No results for input(s): INR, PROTIME in the last 168 hours. Cardiac Enzymes: No results for input(s): CKTOTAL, CKMB, CKMBINDEX, TROPONINI in the last 168 hours. BNP (last 3 results) No results for input(s): PROBNP in the last 8760 hours. HbA1C: No results for input(s): HGBA1C in the last 72 hours. CBG: No results for input(s): GLUCAP in the last 168 hours. Lipid Profile: No results for input(s): CHOL, HDL, LDLCALC, TRIG, CHOLHDL, LDLDIRECT in the last 72 hours. Thyroid Function Tests: No results for input(s): TSH, T4TOTAL, FREET4, T3FREE, THYROIDAB in the last 72 hours. Anemia Panel: No results for input(s): VITAMINB12, FOLATE, FERRITIN, TIBC, IRON, RETICCTPCT in the last 72 hours. Urine analysis: No results found for: COLORURINE, APPEARANCEUR, LABSPEC, PHURINE, GLUCOSEU, HGBUR, BILIRUBINUR, KETONESUR, PROTEINUR, UROBILINOGEN, NITRITE, LEUKOCYTESUR Sepsis Labs: @LABRCNTIP (procalcitonin:4,lacticacidven:4)  ) Recent Results (from the past 240 hour(s))  SARS CORONAVIRUS 2 (TAT 6-24 HRS) Nasopharyngeal Nasopharyngeal Swab     Status: None   Collection Time: 06/02/19  5:43 AM   Specimen: Nasopharyngeal Swab  Result Value Ref Range Status   SARS Coronavirus 2 NEGATIVE NEGATIVE Final    Comment: (NOTE) SARS-CoV-2 target nucleic acids are NOT DETECTED. The SARS-CoV-2 RNA is generally detectable in upper and lower respiratory specimens during the acute phase of infection. Negative results do not preclude SARS-CoV-2 infection, do not  rule out co-infections with other pathogens, and should not be used as the sole basis for treatment or other patient management decisions. Negative results must be combined with clinical observations, patient history, and epidemiological information. The expected result is Negative. Fact Sheet for Patients: HairSlick.nohttps://www.fda.gov/media/138098/download Fact Sheet for Healthcare Providers: quierodirigir.comhttps://www.fda.gov/media/138095/download This test is not yet approved or cleared by the [place] FDA and  has been authorized for detection and/or diagnosis of SARS-CoV-2 by FDA under an Emergency Use Authorization (EUA). This EUA will remain  in effect (meaning this test can be used) for the duration of the COVID-19 declaration under Section 56 4(b)(1) of the Act, 21 U.S.C. section 360bbb-3(b)(1), unless the authorization is terminated or revoked sooner. Performed at [facility], [address] [place], [place] [address]          Radiology Studies: Dg Abd 1 View  Result Date: 06/03/2019 CLINICAL DATA:  Severe abdominal pain swallowed 2 razor blades EXAM: ABDOMEN - 1 VIEW COMPARISON:  06/03/2019 FINDINGS: Nonobstructed  gas pattern. Rectangular foreign bodies in the right lower and right upper quadrant, likely overlying ascending colon and proximal transverse colon. Some migration of the distal foreign body, not much change in appearance/position of the right lower quadrant foreign body. IMPRESSION: 1. Nonobstructed gas pattern 2. Two rectangular metallic foreign bodies project over the right colon and proximal transverse colon. Electronically Signed   By: Jasmine Pang M.D.   On: 06/03/2019 17:02   Dg Abd 2 Views  Result Date: 06/04/2019 CLINICAL DATA:  33 year old male with ingestion of razor bleed. EXAM: ABDOMEN - 2 VIEW COMPARISON:  Abdominal radiograph dated 06/03/2019. FINDINGS: Two radiopaque foreign objects noted, one in the proximal colon/cecum and the other in the distal  transverse colon consistent with reported ingested razor blades. The foreign object in the cecum appears relatively in similar position as the prior radiograph. There has been interval distal migration of the more distal radiopaque foreign object from the proximal transverse colon on the prior radiograph to the distal transverse colon on this radiograph. There is moderate stool throughout the colon. No bowel dilatation or evidence of obstruction. No free air or radiopaque calculi. The osseous structures and soft tissues are unremarkable. IMPRESSION: 1. Two radiopaque foreign objects in the colon as described. Interval migration of the more distal foreign object into the distal transverse colon. 2. No free air or bowel obstruction. Electronically Signed   By: Elgie Collard M.D.   On: 06/04/2019 21:58        Scheduled Meds: . docusate sodium  200 mg Oral BID  . polyethylene glycol  17 g Oral Daily  . prazosin  4 mg Oral QHS  . psyllium  1 packet Oral TID  . sertraline  50 mg Oral Daily   Continuous Infusions:    LOS: 3 days    Time spent: 25 minutes    Alberteen Sam, MD [facility] 06/05/2019, 12:44 PM     Please page though AMION or Epic secure chat:  For password, contact charge nurse

## 2019-06-05 NOTE — ED Notes (Signed)
Dr. Danford at bedside  

## 2019-06-05 NOTE — ED Notes (Signed)
Pt refusing to drink metamucil and miralax drinks. They are sitting at bedside but pt refuses to drink them at this time. Pt informed that is what is going to help him pass razor and keep stool soft for easy passing but pt remains refusing them.

## 2019-06-05 NOTE — ED Notes (Signed)
Pt ambulatory to toilet to have BM. Blood noted when pt wipes.

## 2019-06-05 NOTE — Progress Notes (Signed)
06/05/2019  Subjective: Patient this morning again having some LLQ pain which is the same as previous days, intermittent in nature, like cramping pain.  Had bowel movement and noticing some blood on the toilet paper.  KUB this morning with the larger razor now in the descending/sigmoid junction, and the small piece still in the right lower quadrant.  Vital signs: Temp:  [98.1 F (36.7 C)] 98.1 F (36.7 C) (12/05 1649) Pulse Rate:  [66-70] 70 (12/05 2312) Resp:  [16] 16 (12/05 2312) BP: (129-146)/(78-83) 146/83 (12/05 2312) SpO2:  [98 %-99 %] 99 % (12/05 2312)   Intake/Output: No intake/output data recorded. Last BM Date: 06/04/19  Physical Exam: Constitutional:  No acute distress Abdomen:  Soft, non-distended, with discomfort in the left lower quadrant, same area as prior episodes.  Labs:  No results for input(s): WBC, HGB, HCT, PLT in the last 72 hours. No results for input(s): NA, K, CL, CO2, GLUCOSE, BUN, CREATININE, CALCIUM in the last 72 hours.  Invalid input(s): MAGNESIUM No results for input(s): LABPROT, INR in the last 72 hours.  Imaging: No results found.  Assessment/Plan: This is a 33 y.o. male with two ingested razors.  KUB this morning does not show any free air and the bigger razor piece is moving along.  The discomfort the patient is having is not new and has been intermittent in the same location even when the razors were on the opposite side of his abdomen.  Offered to give Flexeril to see if this is muscle spasms, but he says it makes his legs hurt and declined the medication.  Encouraged him to take ibuprofen too.    Continue serial KUBs.  Uncertain when the small piece may move along but at least I would want the larger razor piece to be out before discharge.   Melvyn Neth, [facility]

## 2019-06-06 ENCOUNTER — Inpatient Hospital Stay

## 2019-06-06 NOTE — Progress Notes (Signed)
PROGRESS NOTE    [place].  VVO:[id number] DOB: 02/06/1986 DOA: 06/02/2019 PCP: Patient, No Pcp Per      Brief Narrative:  Christopher Stephenson is a 33 y.o. M with hx depression and HTN and PTSD presented with intentional ingestion of a razor blade.  Patient is in police custody, obtained a razor blade and attempted to slit his wrists.  He saw a guard coming, and so he swallowed the razor blade.  In the ER, radiograph confirmed a radioopaque object superimposed over the small bowel.  He had abdominal pain.  Psych were consulted who recommended no psychiatric treatment and stated "Patient is not suicidal, homicidal or psychotic and does not need inpatient psychiatric care."    GI and General Surgery were consulted who recommended serial abdominal exams.        Assessment & Plan:  Suicide attempt by razor blade No change in pain.  AXR personally reviewed, large postion now in sigmoid, about to pass.  Small fragment not seen.  No free air  -Daily AXR -Serial abdominal exams  -Docusate, psyllium, defer other laxatives to surg     PTSD -Continue prazosin, sertraline    DVT prophylaxis: SCDs Code Status: FULL Family Communication:    MDM and disposition Plan:  The below labs and imaging reports reviewed and summarized above.  Medication management as above.   The patient was admitted with ingestion of razor blade. General Surgery expect this to pass.    No passage of blade.  If he develops perforation, will need surgery.         Objective: Vitals:   06/05/19 1232 06/05/19 2106 06/06/19 0645 06/06/19 1255  BP: 124/80 130/84 (!) 128/91 126/80  Pulse: 74 75 75 75  Resp: 16  18 20   Temp:  98 F (36.7 C) 98.2 F (36.8 C) 98.2 F (36.8 C)  TempSrc:   Oral Oral  SpO2: 98% 99% 99% 97%  Weight:      Height:        Intake/Output Summary (Last 24 hours) at 06/06/2019 1436 Last data filed at 06/06/2019 0529 Gross per 24 hour  Intake 360 ml  Output --  Net  360 ml   Filed Weights   06/02/19 0147 06/02/19 0154  Weight: 72.6 kg 74.8 kg    Examination: General appearance:  adult male, alert and in mild distress from pain.   HEENT: Anicteric, conjunctiva pink, lids and lashes normal. No nasal deformity, discharge, epistaxis.  Lips moist, teeth normal. OP normal, no oral lesions.   Skin: Warm and dry.  No suspicious rashes or lesions. Cardiac: RRR, no murmurs appreciated.  No LE edema.    Respiratory: Normal respiratory rate and rhythm.  CTAB without rales or wheezes. Abdomen: Abdomen soft.  Diffuse tenderness to palpation, however the guarding is all voluntary, no rigidity or rebound. No ascites, distension, hepatosplenomegaly.   MSK: No deformities or effusions of the large joints of the upper or lower extremities bilaterally. Neuro: Awake and alert. Naming is grossly intact, and the patient's recall, recent and remote, as well as general fund of knowledge seem within normal limits.  Muscle tone normal, without fasciculations.  Moves all extremities equally and with normal coordination.  Marland Kitchen Speech fluent.    Psych: Sensorium intact and responding to questions, attention normal. Affect anxious, stressed.  Judgment and insight appear normal.           Data Reviewed: I have personally reviewed following labs and imaging studies:  CBC: Recent  Labs  Lab 06/02/19 0153 06/02/19 0543  WBC 8.2 7.2  HGB 16.2 15.4  HCT 45.0 43.2  MCV 84.6 84.5  PLT 289 271   Basic Metabolic Panel: Recent Labs  Lab 06/02/19 0153 06/02/19 0543  NA 138 140  K 3.9 3.6  CL 103 105  CO2 27 27  GLUCOSE 102* 93  BUN 10 8  CREATININE 0.87 0.83  CALCIUM 9.1 8.9   GFR: Estimated Creatinine Clearance: 126.6 mL/min (by C-G formula based on SCr of 0.83 mg/dL). Liver Function Tests: Recent Labs  Lab 06/02/19 0153  AST 18  ALT 11  ALKPHOS 56  BILITOT 1.0  PROT 7.3  ALBUMIN 4.5   No results for input(s): LIPASE, AMYLASE in the last 168 hours. No  results for input(s): AMMONIA in the last 168 hours. Coagulation Profile: No results for input(s): INR, PROTIME in the last 168 hours. Cardiac Enzymes: No results for input(s): CKTOTAL, CKMB, CKMBINDEX, TROPONINI in the last 168 hours. BNP (last 3 results) No results for input(s): PROBNP in the last 8760 hours. HbA1C: No results for input(s): HGBA1C in the last 72 hours. CBG: No results for input(s): GLUCAP in the last 168 hours. Lipid Profile: No results for input(s): CHOL, HDL, LDLCALC, TRIG, CHOLHDL, LDLDIRECT in the last 72 hours. Thyroid Function Tests: No results for input(s): TSH, T4TOTAL, FREET4, T3FREE, THYROIDAB in the last 72 hours. Anemia Panel: No results for input(s): VITAMINB12, FOLATE, FERRITIN, TIBC, IRON, RETICCTPCT in the last 72 hours. Urine analysis: No results found for: COLORURINE, APPEARANCEUR, LABSPEC, PHURINE, GLUCOSEU, HGBUR, BILIRUBINUR, KETONESUR, PROTEINUR, UROBILINOGEN, NITRITE, LEUKOCYTESUR Sepsis Labs: @LABRCNTIP (procalcitonin:4,lacticacidven:4)  ) Recent Results (from the past 240 hour(s))  SARS CORONAVIRUS 2 (TAT 6-24 HRS) Nasopharyngeal Nasopharyngeal Swab     Status: None   Collection Time: 06/02/19  5:43 AM   Specimen: Nasopharyngeal Swab  Result Value Ref Range Status   SARS Coronavirus 2 NEGATIVE NEGATIVE Final    Comment: (NOTE) SARS-CoV-2 target nucleic acids are NOT DETECTED. The SARS-CoV-2 RNA is generally detectable in upper and lower respiratory specimens during the acute phase of infection. Negative results do not preclude SARS-CoV-2 infection, do not rule out co-infections with other pathogens, and should not be used as the sole basis for treatment or other patient management decisions. Negative results must be combined with clinical observations, patient history, and epidemiological information. The expected result is Negative. Fact Sheet for Patients: 14/03/20 Fact Sheet for Healthcare  Providers: HairSlick.no This test is not yet approved or cleared by the quierodirigir.com FDA and  has been authorized for detection and/or diagnosis of SARS-CoV-2 by FDA under an Emergency Use Authorization (EUA). This EUA will remain  in effect (meaning this test can be used) for the duration of the COVID-19 declaration under Section 56 4(b)(1) of the Act, 21 U.S.C. section 360bbb-3(b)(1), unless the authorization is terminated or revoked sooner. Performed at [facility], [address] [place], [place]          Radiology Studies: Dg Abd 2 Views  Result Date: 06/05/2019 CLINICAL DATA:  Abdominal pain. Possible foreign body EXAM: ABDOMEN - 2 VIEW COMPARISON:  Abdominal radiograph 06/05/2019 at 7:03 a.m. FINDINGS: There are 2 foreign bodies again identified, both projecting over the sigmoid colon. There is a large amount of stool within the colon. There is no free intraperitoneal gas. Bones are normal. IMPRESSION: 1. 2 foreign bodies, both now in the proximal sigmoid colon. 2.  No free intraperitoneal gas. Electronically Signed   By: 14/11/2018  Chase PicketHerman M.D.   On: 06/05/2019 19:38   Dg Abd 2 Views  Result Date: 06/05/2019 CLINICAL DATA:  Ingestion of foreign body, swallowed a razor blade EXAM: ABDOMEN - 2 VIEW COMPARISON:  06/04/2019 FINDINGS: Two metallic foreign bodies are again identified, located at the proximal ascending and distal descending colon consistent with ingested foreign bodies. The ascending colon foreign body has not significantly changed in position since the prior study. The larger more distal foreign body has moved from the splenic flexure to the distal descending colon since the prior exam. Increased stool throughout colon. Small bowel gas pattern normal. No bowel dilatation, bowel wall thickening, or free air. Lung bases clear. Osseous structures unremarkable. IMPRESSION: Two radiopaque foreign bodies again identified, a smaller  foreign body at the proximal ascending colon and a larger foreign body at the distal descending colon which has progressed distally from the splenic flexure since the prior study. Electronically Signed   By: Ulyses SouthwardMark  Boles M.D.   On: 06/05/2019 17:52   Dg Abd Portable 1v  Result Date: 06/06/2019 CLINICAL DATA:  33 year old male with a history of foreign body ingestion EXAM: PORTABLE ABDOMEN - 1 VIEW COMPARISON:  06/05/2019, 06/05/2019, 06/04/2019 FINDINGS: Gas within stomach small bowel and colon. Moderate stool burden. No abnormal distension. One metallic radiopaque foreign body identified within the sigmoid colon. The second radiopaque foreign body is not visualized No acute bony abnormality IMPRESSION: Nonobstructive bowel gas pattern. One metallic radiopaque foreign body persist in the sigmoid colon, and the second is no longer visualized. Electronically Signed   By: Gilmer MorJaime  Wagner D.O.   On: 06/06/2019 07:52        Scheduled Meds:  Chlorhexidine Gluconate Cloth  6 each Topical Q0600   docusate sodium  200 mg Oral BID   mupirocin ointment  1 application Nasal BID   polyethylene glycol  17 g Oral Daily   prazosin  4 mg Oral QHS   psyllium  1 packet Oral TID   sertraline  50 mg Oral Daily   Continuous Infusions:    LOS: 4 days    Time spent: 15 minutes    Alberteen Samhristopher P Karleigh Bunte, MD [facility] 06/06/2019, 2:36 PM     Please page though AMION or Epic secure chat:  For password, contact charge nurse

## 2019-06-06 NOTE — Progress Notes (Addendum)
[facility] SURGICAL PROGRESS NOTE (cpt [phone])  Hospital Day(s): 4.   Interval History: Patient seen and examined, no acute events or new complaints overnight. Patient reports he continues to have LLQ crampy abdominal pain, this is unchanged. No fever, chills, nausea, or emesis. He did have a bowel movement yesterday, loose, question of some blood on toilet paper. Otherwise no other complaints. KUB this morning shows large portion of razor blade in sigmoid colon, smaller piece no longer seen.     Review of Systems:  Constitutional: denies fever, chills  HEENT: denies cough or congestion  Respiratory: denies any shortness of breath  Cardiovascular: denies chest pain or palpitations  Gastrointestinal: + abdominal pain (unchanged), denied N/V, or diarrhea/and bowel function as per interval history Genitourinary: denies burning with urination or urinary frequency   Vital signs in last 24 hours: [min-max] current  Temp:  [98 F (36.7 C)-98.2 F (36.8 C)] 98.2 F (36.8 C) (12/07 0645) Pulse Rate:  [74-75] 75 (12/07 0645) Resp:  [16-18] 18 (12/07 0645) BP: (124-130)/(80-91) 128/91 (12/07 0645) SpO2:  [98 %-99 %] 99 % (12/07 0645)     Height: 5\' 9"  (175.3 cm) Weight: 74.8 kg BMI (Calculated): 24.36   Intake/Output last 2 shifts:  12/06 0701 - 12/07 0700 In: 360 [P.O.:360] Out: -    Physical Exam:  Constitutional: alert, cooperative and no distress, officers at bedside  HENT: normocephalic without obvious abnormality  Eyes: PERRL, EOM's grossly intact and symmetric  Respiratory: breathing non-labored at rest  Cardiovascular: regular rate and sinus rhythm  Gastrointestinal: soft, mild LLQ tenderness (stable), and non-distended.no rebound/guarding. No peritonitis Musculoskeletal: no edema or wounds, motor and sensation grossly intact, NT, handcuffed to bed    Labs:  CBC Latest Ref Rng & Units 06/02/2019 06/02/2019 05/21/2019  WBC 4.0 - 10.5 K/uL 7.2 8.2 6.0  Hemoglobin  13.0 - 17.0 g/dL 15.4 16.2 15.8  Hematocrit 39.0 - 52.0 % 43.2 45.0 48.4  Platelets 150 - 400 K/uL 271 289 214   CMP Latest Ref Rng & Units 06/02/2019 06/02/2019 05/21/2019  Glucose 70 - 99 mg/dL 93 102(H) 99  BUN 6 - 20 mg/dL 8 10 15   Creatinine 0.61 - 1.24 mg/dL 0.83 0.87 0.88  Sodium 135 - 145 mmol/L 140 138 137  Potassium 3.5 - 5.1 mmol/L 3.6 3.9 4.1  Chloride 98 - 111 mmol/L 105 103 102  CO2 22 - 32 mmol/L 27 27 28   Calcium 8.9 - 10.3 mg/dL 8.9 9.1 8.9  Total Protein 6.5 - 8.1 g/dL - 7.3 6.6  Total Bilirubin 0.3 - 1.2 mg/dL - 1.0 1.2  Alkaline Phos 38 - 126 U/L - 56 53  AST 15 - 41 U/L - 18 13(L)  ALT 0 - 44 U/L - 11 15     Imaging studies:   KUB (06/06/2019) personally reviewed showing large portion of razor in distal colon, and radiologist report reviewed:  IMPRESSION: Nonobstructive bowel gas pattern.  One metallic radiopaque foreign body persist in the sigmoid colon, and the second is no longer visualized.   Assessment/Plan: (ICD-10's: X78.66) 33 y.o. male with swallowed foreign body (razor blade), complicated by pertinent comorbidities includingdepression/self harmand extensive history of similar presentations.   - Okay for diet as tolerates  - Serial abdominal examination + KUBs   - No emergent surgical intervention; we will continue to closely follow   - Continue current bowel regiment  - Further management per primary team     - Discharge Planning: Await passage of foreign body  All of the above findings and recommendations were discussed with the patient, and the medical team, and all of patient's questions were answered to his expressed satisfaction.  -- Lynden Oxford, PA-C [facility] 06/06/2019, 7:28 AM [phone] M-F: 7am - 4pm  I saw and evaluated the patient.  I agree with the above documentation, exam, and plan, which I have edited where appropriate. Duanne Guess  10:33 AM

## 2019-06-07 ENCOUNTER — Inpatient Hospital Stay

## 2019-06-07 MED ORDER — POLYETHYLENE GLYCOL 3350 17 G PO PACK
17.0000 g | PACK | Freq: Once | ORAL | Status: AC
  Administered 2019-06-07: 17 g via ORAL
  Filled 2019-06-07: qty 1

## 2019-06-07 NOTE — Progress Notes (Addendum)
[place] Hospital Day(s): 5.   Interval History: Patient seen and examined, no acute events or new complaints overnight. Patient reports he is now having some RLQ crampy pain. No fever, chills, nausea, or emesis. He did have bowel movements yesterday which were non-bloody. He does not think he has passed a razor blade yet. KUB this morning no longer showing large portion of the razor blade, smaller piece is now visible in descending colon.   Review of Systems:  Constitutional: denies fever, chills  Respiratory: denies any shortness of breath  Cardiovascular: denies chest pain or palpitations  Gastrointestinal: + abdominal pain (unchanged), denied N/V, or diarrhea/and bowel function as per interval history Musculoskeletal: denies pain, decreased motor or sensation  Vital signs in last 24 hours: [min-max] current  Temp:  [97.7 F (36.5 C)-98.3 F (36.8 C)] 97.7 F (36.5 C) (12/08 0603) Pulse Rate:  [75-80] 80 (12/08 0603) Resp:  [16-20] 16 (12/08 0603) BP: (125-142)/(80-89) 125/89 (12/08 0603) SpO2:  [97 %-100 %] 100 % (12/08 0603)     Height: 5\' 9"  (175.3 cm) Weight: 74.8 kg BMI (Calculated): 24.36   Intake/Output last 2 shifts:  12/07 0701 - 12/08 0700 In: 720 [P.O.:720] Out: -    Physical Exam:  Constitutional: alert, cooperative and no distress, officers at bedside  HENT: normocephalic without obvious abnormality  Eyes: PERRL, EOM's grossly intact and symmetric  Respiratory: breathing non-labored at rest  Cardiovascular: regular rate and sinus rhythm  Gastrointestinal: soft, mild RLQ tenderness now today, and non-distended.no rebound/guarding. No peritonitis Musculoskeletal: no edema or wounds, motor and sensation grossly intact, NT, handcuffed to bed   Labs:  CBC Latest Ref Rng & Units 06/02/2019 06/02/2019 05/21/2019  WBC 4.0 - 10.5 K/uL 7.2 8.2 6.0  Hemoglobin 13.0 - 17.0 g/dL 15.4 16.2 15.8  Hematocrit 39.0 - 52.0 % 43.2 45.0  48.4  Platelets 150 - 400 K/uL 271 289 214   CMP Latest Ref Rng & Units 06/02/2019 06/02/2019 05/21/2019  Glucose 70 - 99 mg/dL 93 102(H) 99  BUN 6 - 20 mg/dL 8 10 15   Creatinine 0.61 - 1.24 mg/dL 0.83 0.87 0.88  Sodium 135 - 145 mmol/L 140 138 137  Potassium 3.5 - 5.1 mmol/L 3.6 3.9 4.1  Chloride 98 - 111 mmol/L 105 103 102  CO2 22 - 32 mmol/L 27 27 28   Calcium 8.9 - 10.3 mg/dL 8.9 9.1 8.9  Total Protein 6.5 - 8.1 g/dL - 7.3 6.6  Total Bilirubin 0.3 - 1.2 mg/dL - 1.0 1.2  Alkaline Phos 38 - 126 U/L - 56 53  AST 15 - 41 U/L - 18 13(L)  ALT 0 - 44 U/L - 11 15     Imaging studies:  KUB (06/07/2019) personally reviewed which no longer shows large piece of razor blade however the smaller piece is again visible in the descending colon, and radiologist report reviewed:  1. Air-filled loops of small large bowel noted on today's exam suggesting adynamic ileus. Follow-up exam suggested to demonstrate resolution.  2. A single radiopacities now noted over the left mid colon on today's exam. This consistent persistent foreign body.    Assessment/Plan: (ICD-10's: X78.71) 33 y.o. male with swallowed foreign body (razor blade - has now passed large of the two pieces), complicated by pertinent comorbidities includingdepression/self harmand extensive history of similar presentations.   - Okay for diet as tolerates             - Serial abdominal examination + KUBs              -  No emergent surgical intervention              - Continue current bowel regiment             - Further management per primary team; at this point he has passed the larger of the two foreign bodies, surgery will sign off and follow peripherally             - Clinically, there is no concern for ileus, despite radiologist statement, as patient is passing gas, having bowel movements, and tolerating a diet.             - Discharge Planning: Discharge per medicine service  All of the above findings and recommendations  were discussed with the patient, and the medical team, and all of patient's questions were answered to his expressed satisfaction.  -- Lynden Oxford, PA-C [facility] 06/07/2019, 7:30 AM [phone] M-F: 7am - 4pm  I saw and evaluated the patient.  I agree with the above documentation, exam, and plan, which I have edited where appropriate. Duanne Guess  9:11 AM

## 2019-06-07 NOTE — Progress Notes (Signed)
RN to notified Dr. Loleta Books pt is requesting to see psychiatrist. Pt stated his medication are not working. RN sent a secure chat to Dr Weber Cooks to notified him pt want to talk to a psychiatrist. RN also place a social worker consult per pt request. RN will continue to assess and monitor pt.

## 2019-06-07 NOTE — Progress Notes (Signed)
PROGRESS NOTE    Christopher Stephenson.  DTO:[id number] DOB: Sep 29, 1985 DOA: 06/02/2019 PCP: Patient, No Pcp Per      Brief Narrative:  Christopher Stephenson is a 33 y.o. M with hx depression and HTN and PTSD presented with intentional ingestion of a razor blade.  Patient is in police custody, obtained a razor blade and attempted to slit his wrists.  He saw a guard coming, and so he swallowed the razor blade.  In the ER, radiograph confirmed a radioopaque object superimposed over the small bowel.  He had abdominal pain.  Psych were consulted who recommended no psychiatric treatment and stated "Patient is not suicidal, homicidal or psychotic and does not need inpatient psychiatric care."    GI and General Surgery were consulted who recommended serial abdominal exams.        Assessment & Plan:  Suicide attempt by razor blade Pain seems improved today.  Abdominal x-ray this morning reviewed by me personally, large portion has now passed.  The smaller fragment is seen in the sigmoid.  No free air.    Patient actually unaware that he passed the larger blade fragment.  -Continue oral diet -Continue docusate, psyllium, MiraLAX -We will repeat x-ray this afternoon, if he has another bowel movement, otherwise tomorrow morning -I recommend remaining in hospital until second blade fragment has been passed   PTSD -Continue prazosin, sertraline    DVT prophylaxis: SCDs Code Status: FULL Family Communication:    MDM and disposition Plan:  The below labs and imaging reports reviewed and summarized above.  Medication management as above.    The patient was admitted with ingestion of razor blade. He was evaluated by psychiatry who recommended continuing his current medication, discharge and psychiatry follow-up at his facility.  He has passed a larger plate fragment, and general surgery have signed off for now.  He still has remaining blade fragment in the colon.  We will continue monitoring  in the hospital until this is passed, then discharge once radiograph confirms second fragment is passed.           Objective: Vitals:   06/06/19 1255 06/06/19 2036 06/07/19 0603 06/07/19 1143  BP: 126/80 (!) 142/85 125/89 129/84  Pulse: 75 75 80 82  Resp: 20 16 16 20   Temp: 98.2 F (36.8 C) 98.3 F (36.8 C) 97.7 F (36.5 C) (!) 97.5 F (36.4 C)  TempSrc: Oral Oral Oral Oral  SpO2: 97% 97% 100% 99%  Weight:      Height:        Intake/Output Summary (Last 24 hours) at 06/07/2019 1406 Last data filed at 06/07/2019 0518 Gross per 24 hour  Intake 720 ml  Output --  Net 720 ml   Filed Weights   06/02/19 0147 06/02/19 0154  Weight: 72.6 kg 74.8 kg    Examination: General appearance:  adult male, alert and in no acute distress.   HEENT: Anicteric, conjunctiva pink, lids and lashes normal. No nasal deformity, discharge, epistaxis.  Lips moist, teeth normal. OP normal, no oral lesions.   Skin: Warm and dry.  No suspicious rashes or lesions. Cardiac: RRR, no murmurs appreciated.  No LE edema.    Respiratory: Normal respiratory rate and rhythm.  CTAB without rales or wheezes. Abdomen: Abdomen soft.  Mild right lower quadrant tenderness, no rigidity or rebound. No ascites, distension, hepatosplenomegaly.   MSK: No deformities or effusions of the large joints of the upper or lower extremities bilaterally. Neuro: Awake and alert. Naming is grossly  intact, and the patient's recall, recent and remote, as well as general fund of knowledge seem within normal limits.  Muscle tone normal, without fasciculations.  Moves all extremities equally and with normal coordination.  Marland Kitchen Speech fluent.    Psych: Sensorium intact and responding to questions, attention normal. Affect normal.  Judgment and insight appear normal.           Data Reviewed: I have personally reviewed following labs and imaging studies:  CBC: Recent Labs  Lab 06/02/19 0153 06/02/19 0543  WBC 8.2 7.2  HGB 16.2  15.4  HCT 45.0 43.2  MCV 84.6 84.5  PLT 289 016   Basic Metabolic Panel: Recent Labs  Lab 06/02/19 0153 06/02/19 0543  NA 138 140  K 3.9 3.6  CL 103 105  CO2 27 27  GLUCOSE 102* 93  BUN 10 8  CREATININE 0.87 0.83  CALCIUM 9.1 8.9   GFR: Estimated Creatinine Clearance: 126.6 mL/min (by C-G formula based on SCr of 0.83 mg/dL). Liver Function Tests: Recent Labs  Lab 06/02/19 0153  AST 18  ALT 11  ALKPHOS 56  BILITOT 1.0  PROT 7.3  ALBUMIN 4.5   No results for input(s): LIPASE, AMYLASE in the last 168 hours. No results for input(s): AMMONIA in the last 168 hours. Coagulation Profile: No results for input(s): INR, PROTIME in the last 168 hours. Cardiac Enzymes: No results for input(s): CKTOTAL, CKMB, CKMBINDEX, TROPONINI in the last 168 hours. BNP (last 3 results) No results for input(s): PROBNP in the last 8760 hours. HbA1C: No results for input(s): HGBA1C in the last 72 hours. CBG: No results for input(s): GLUCAP in the last 168 hours. Lipid Profile: No results for input(s): CHOL, HDL, LDLCALC, TRIG, CHOLHDL, LDLDIRECT in the last 72 hours. Thyroid Function Tests: No results for input(s): TSH, T4TOTAL, FREET4, T3FREE, THYROIDAB in the last 72 hours. Anemia Panel: No results for input(s): VITAMINB12, FOLATE, FERRITIN, TIBC, IRON, RETICCTPCT in the last 72 hours. Urine analysis: No results found for: COLORURINE, APPEARANCEUR, LABSPEC, PHURINE, GLUCOSEU, HGBUR, BILIRUBINUR, KETONESUR, PROTEINUR, UROBILINOGEN, NITRITE, LEUKOCYTESUR Sepsis Labs: @LABRCNTIP (procalcitonin:4,lacticacidven:4)  ) Recent Results (from the past 240 hour(s))  SARS CORONAVIRUS 2 (TAT 6-24 HRS) Nasopharyngeal Nasopharyngeal Swab     Status: None   Collection Time: 06/02/19  5:43 AM   Specimen: Nasopharyngeal Swab  Result Value Ref Range Status   SARS Coronavirus 2 NEGATIVE NEGATIVE Final    Comment: (NOTE) SARS-CoV-2 target nucleic acids are NOT DETECTED. The SARS-CoV-2 RNA is generally  detectable in upper and lower respiratory specimens during the acute phase of infection. Negative results do not preclude SARS-CoV-2 infection, do not rule out co-infections with other pathogens, and should not be used as the sole basis for treatment or other patient management decisions. Negative results must be combined with clinical observations, patient history, and epidemiological information. The expected result is Negative. Fact Sheet for Patients: SugarRoll.be Fact Sheet for Healthcare Providers: https://www.woods-mathews.com/ This test is not yet approved or cleared by the [place] FDA and  has been authorized for detection and/or diagnosis of SARS-CoV-2 by FDA under an Emergency Use Authorization (EUA). This EUA will remain  in effect (meaning this test can be used) for the duration of the COVID-19 declaration under Section 56 4(b)(1) of the Act, 21 U.S.C. section 360bbb-3(b)(1), unless the authorization is terminated or revoked sooner. Performed at [facility], [place] [address] [place], [place] [address]          Radiology Studies: Dg Abd 1 View  Result Date: 06/07/2019 CLINICAL DATA:  Foreign body ingestion. EXAM: ABDOMEN - 1 VIEW COMPARISON:  06/06/2019. FINDINGS: Air-filled loops of small and large bowel noted on today's exam suggesting adynamic ileus. Follow-up exam suggested to demonstrate resolution. Stool in the colon. No free air. A single radiopacity is now noted over the left mid colon on today's exam. This consistent with a persistent foreign body. No acute bony abnormality identified. IMPRESSION: 1. Air-filled loops of small large bowel noted on today's exam suggesting adynamic ileus. Follow-up exam suggested to demonstrate resolution. 2. A single radiopacities now noted over the left mid colon on today's exam. This consistent persistent foreign body. Electronically Signed   By: Maisie Fushomas  Register   On: 06/07/2019  08:29   Dg Abd 2 Views  Result Date: 06/05/2019 CLINICAL DATA:  Abdominal pain. Possible foreign body EXAM: ABDOMEN - 2 VIEW COMPARISON:  Abdominal radiograph 06/05/2019 at 7:03 a.m. FINDINGS: There are 2 foreign bodies again identified, both projecting over the sigmoid colon. There is a large amount of stool within the colon. There is no free intraperitoneal gas. Bones are normal. IMPRESSION: 1. 2 foreign bodies, both now in the proximal sigmoid colon. 2.  No free intraperitoneal gas. Electronically Signed   By: Deatra RobinsonKevin  Herman M.D.   On: 06/05/2019 19:38   Dg Abd Portable 1v  Result Date: 06/06/2019 CLINICAL DATA:  33 year old male with a history of foreign body ingestion EXAM: PORTABLE ABDOMEN - 1 VIEW COMPARISON:  06/05/2019, 06/05/2019, 06/04/2019 FINDINGS: Gas within stomach small bowel and colon. Moderate stool burden. No abnormal distension. One metallic radiopaque foreign body identified within the sigmoid colon. The second radiopaque foreign body is not visualized No acute bony abnormality IMPRESSION: Nonobstructive bowel gas pattern. One metallic radiopaque foreign body persist in the sigmoid colon, and the second is no longer visualized. Electronically Signed   By: Gilmer MorJaime  Wagner D.O.   On: 06/06/2019 07:52        Scheduled Meds:  Chlorhexidine Gluconate Cloth  6 each Topical Q0600   docusate sodium  200 mg Oral BID   mupirocin ointment  1 application Nasal BID   polyethylene glycol  17 g Oral Daily   prazosin  4 mg Oral QHS   psyllium  1 packet Oral TID   sertraline  50 mg Oral Daily   Continuous Infusions:    LOS: 5 days    Time spent: 25 minutes    Alberteen Samhristopher P Scherrie [place], MD [facility] 06/07/2019, 2:06 PM     Please page though AMION or Epic secure chat:  For password, contact charge nurse

## 2019-06-08 ENCOUNTER — Inpatient Hospital Stay

## 2019-06-08 DIAGNOSIS — T189XXD Foreign body of alimentary tract, part unspecified, subsequent encounter: Secondary | ICD-10-CM

## 2019-06-08 DIAGNOSIS — F331 Major depressive disorder, recurrent, moderate: Secondary | ICD-10-CM

## 2019-06-08 MED ORDER — SERTRALINE HCL 50 MG PO TABS: 50.0000 mg | ORAL_TABLET | Freq: Every day | ORAL | 2 refills | Status: AC

## 2019-06-08 MED ORDER — POLYETHYLENE GLYCOL 3350 17 G PO PACK: 17.0000 g | PACK | Freq: Every day | ORAL | 0 refills | Status: DC

## 2019-06-08 MED ORDER — MORPHINE SULFATE 15 MG PO TABS: 22.5000 mg | ORAL_TABLET | Freq: Four times a day (QID) | ORAL | 0 refills | Status: DC | PRN

## 2019-06-08 NOTE — Discharge Instructions (Signed)
Repeat abdominal xray in 1 week to ensure passage of foreign objects

## 2019-06-08 NOTE — Progress Notes (Signed)
Christopher Stephenson Christopher Stephenson.  A and O x 4. VSS. Pt tolerating diet well. No complaints of pain or nausea. IV removed intact, prescriptions given. Pt voiced understanding of discharge instructions with no further questions. [facility] [employer].    Allergies as of 06/08/2019      Reactions   Penicillins Other (See Comments)   Unsure Did it involve swelling of the face/tongue/throat, SOB, or low BP? unknown Did it involve sudden or severe rash/hives, skin peeling, or any reaction on the inside of your mouth or nose? unknown Did you need to seek medical attention at a hospital or doctor's office? unknown When did it last happen?childhood allergy If all above answers are "NO", may proceed with cephalosporin use.      Medication List    TAKE these medications   docusate sodium 100 MG capsule Commonly known as: COLACE Take 2 capsules (200 mg total) by mouth 2 (two) times daily.   morphine 15 MG tablet Commonly known as: MSIR Take 1.5 tablets (22.5 mg total) by mouth every 6 (six) hours as needed for severe pain.   polyethylene glycol 17 g packet Commonly known as: MIRALAX / GLYCOLAX Take 17 g by mouth daily. Start taking on: June 09, 2019   prazosin 2 MG capsule Commonly known as: MINIPRESS Take 2 capsules (4 mg total) by mouth at bedtime.   psyllium 95 % Pack Commonly known as: HYDROCIL/METAMUCIL Take 1 packet by mouth 3 (three) times daily.   sertraline 50 MG tablet Commonly known as: Zoloft Take 1 tablet (50 mg total) by mouth daily. What changed:   medication strength  how much to take       Vitals:   06/07/19 2116 06/08/19 0442  BP: 129/74 121/85  Pulse: 74 97  Resp: 16 18  Temp: 98.2 F (36.8 C) 98 F (36.7 C)  SpO2: 99% 97%    Christopher Stephenson

## 2019-06-08 NOTE — Discharge Summary (Signed)
Physician Discharge Summary  Christopher Stephenson Alwyn Ren. HYQ:[id number] DOB: 1986/03/22 DOA: 06/02/2019  PCP: Patient, No Pcp Per  Admit date: 06/02/2019 Discharge date: 06/08/2019  Admitted From: correctional facility Disposition:  Correctional facility  Recommendations for Outpatient Follow-up:  1. Follow up with psychiatry in 1 week for depression 2. Repeat abdominal xray in 1 week to ensure passage of foreign objects  Discharge Condition:stable CODE STATUS:full code Diet recommendation: regular diet  Brief/Interim Summary: 33 y/o male with a history of depression, hypertension, presents to the emergency room with intentional ingestion of a razor blade.  Patient was in police custody, he obtained a razor blade to slit his wrists.  He saw an officer approaching him, so he swallowed the razor blade.  In the emergency room, radiographs confirmed radio opaque object superimposed over the small bowel.  Psychiatry was consulted.  They recommended no inpatient psychiatric treatment.  General surgery was also consulted.  Discharge Diagnoses:  Active Problems:   Ingestion of foreign body   Intentional self-harm by razor blade (HCC)   Foreign body aspiration, sequela   Major depressive disorder, recurrent episode, moderate (HCC)   Suicidal behavior with attempted self-injury (HCC)  1. Suicide attempt by razor blade. Patient evaluated by psychiatry and it was not felt that he was suicidal. His zoloft dosing was increased from  daily to  daily and it was recommended that patient follow up with psychiatry in the outpatient setting. 2. Foreign object ingestion. Seen by General surgery who did not feel that surgical management was indicated. The razor blade had broken into 2 pieces. The larger piece had passed without incident. The smaller piece is currently at the rectosigmoid junction and is expected to pass. During his hospital stay, patient managed to swallow a blunt screw. This was noted in RUQ  on xray imaging, indicating in the duodenum. Reviewed with general surgery and no indications for surgery. It was not felt that patient had to remain in the hospital until these objects are passed. It is recommended that patient have a repeat xray in 7 days to ensure passing of foreign objects. He should be monitored for any worsening symptoms including worsening abdominal pain, significant GI bleeding or persistent vomiting. He willa also need to be monitored to ensure she does not ingest any other foreign objects. He is currently accompanied by [place] marshalls who have ensured that he will be observed closely.  Discharge Instructions  Discharge Instructions    Diet - low sodium heart healthy   Complete by: As directed    Increase activity slowly   Complete by: As directed      Allergies as of 06/08/2019      Reactions   Penicillins Other (See Comments)   Unsure Did it involve swelling of the face/tongue/throat, SOB, or low BP? unknown Did it involve sudden or severe rash/hives, skin peeling, or any reaction on the inside of your mouth or nose? unknown Did you need to seek medical attention at a hospital or doctor's office? unknown When did it last happen?childhood allergy If all above answers are "NO", may proceed with cephalosporin use.      Medication List    TAKE these medications   docusate sodium 100 MG capsule Commonly known as: COLACE Take 2 capsules (200 mg total) by mouth 2 (two) times daily.   morphine 15 MG tablet Commonly known as: MSIR Take 1.5 tablets (22.5 mg total) by mouth every 6 (six) hours as needed for severe pain.   polyethylene glycol 17  g packet Commonly known as: MIRALAX / GLYCOLAX Take 17 g by mouth daily. Start taking on: June 09, 2019   prazosin 2 MG capsule Commonly known as: MINIPRESS Take 2 capsules (4 mg total) by mouth at bedtime.   psyllium 95 % Pack Commonly known as: HYDROCIL/METAMUCIL Take 1 packet by mouth 3 (three) times  daily.   sertraline 50 MG tablet Commonly known as: Zoloft Take 1 tablet (50 mg total) by mouth daily. What changed:   medication strength  how much to take       Allergies  Allergen Reactions  . Penicillins Other (See Comments)    Unsure Did it involve swelling of the face/tongue/throat, SOB, or low BP? unknown Did it involve sudden or severe rash/hives, skin peeling, or any reaction on the inside of your mouth or nose? unknown Did you need to seek medical attention at a hospital or doctor's office? unknown When did it last happen?childhood allergy If all above answers are "NO", may proceed with cephalosporin use.     Consultations:  General surgery  Psychiatry     Procedures/Studies: Dg Abd 1 View  Result Date: 06/07/2019 CLINICAL DATA:  Foreign body ingestion. EXAM: ABDOMEN - 1 VIEW COMPARISON:  06/06/2019. FINDINGS: Air-filled loops of small and large bowel noted on today's exam suggesting adynamic ileus. Follow-up exam suggested to demonstrate resolution. Stool in the colon. No free air. A single radiopacity is now noted over the left mid colon on today's exam. This consistent with a persistent foreign body. No acute bony abnormality identified. IMPRESSION: 1. Air-filled loops of small large bowel noted on today's exam suggesting adynamic ileus. Follow-up exam suggested to demonstrate resolution. 2. A single radiopacities now noted over the left mid colon on today's exam. This consistent persistent foreign body. Electronically Signed   By: Maisie Fus  Register   On: 06/07/2019 08:29   Dg Abd 1 View  Result Date: 06/03/2019 CLINICAL DATA:  Severe abdominal pain swallowed 2 razor blades EXAM: ABDOMEN - 1 VIEW COMPARISON:  06/03/2019 FINDINGS: Nonobstructed gas pattern. Rectangular foreign bodies in the right lower and right upper quadrant, likely overlying ascending colon and proximal transverse colon. Some migration of the distal foreign body, not much change in  appearance/position of the right lower quadrant foreign body. IMPRESSION: 1. Nonobstructed gas pattern 2. Two rectangular metallic foreign bodies project over the right colon and proximal transverse colon. Electronically Signed   By: Jasmine Pang M.D.   On: 06/03/2019 17:02   Dg Abd 1 View  Result Date: 05/22/2019 CLINICAL DATA:  Ingestion of foreign bodies. EXAM: ABDOMEN - 1 VIEW COMPARISON:  Radiograph yesterday. FINDINGS: One residual foreign body projects over the left lower quadrant likely in the sigmoid colon. The remainder the foreign body soft cleared. No bowel dilatation or obstruction. No evidence of free air. Moderate stool burden. IMPRESSION: Single residual foreign body projects over the left lower quadrant, likely in the sigmoid colon. Remainder of the foreign bodies have cleared. Electronically Signed   By: Narda Rutherford M.D.   On: 05/22/2019 06:16   Dg Abd 1 View  Result Date: 05/21/2019 CLINICAL DATA:  Swallowed foreign body. EXAM: ABDOMEN - 1 VIEW COMPARISON:  Radiograph yesterday. FINDINGS: Seven radiopaque foreign bodies project over the pelvis in the region of the sigmoid colon and rectum. Single foreign body projects over the splenic flexure of the colon. No small bowel dilatation or obstruction. No evidence of free air. IMPRESSION: Majority of foreign bodies are seen within the pelvis in the sigmoid  colon and rectum, with a single foreign body in the splenic flexure of the colon. Electronically Signed   By: Keith Rake M.D.   On: 05/21/2019 06:48   Dg Abd 1 View  Result Date: 05/20/2019 CLINICAL DATA:  Foreign body ingestion, progression EXAM: ABDOMEN - 1 VIEW COMPARISON:  Portable exam 0446 hours compared to 05/19/2019 FINDINGS: Multiple linear metallic foreign bodies are again identified projecting over the abdomen, 8 in number, projecting over proximal transverse through descending colon. Nonobstructive bowel gas pattern. No bowel dilatation or bowel wall thickening.  Osseous structures unremarkable. IMPRESSION: Multiple metallic foreign bodies are again identified projecting over colon as above. Electronically Signed   By: Lavonia Dana M.D.   On: 05/20/2019 08:03   Dg Abd 1 View  Result Date: 05/19/2019 CLINICAL DATA:  Progression of foreign bodies. EXAM: ABDOMEN - 1 VIEW COMPARISON:  May 18, 2019. FINDINGS: No abnormal bowel dilatation is noted. 2 curvilinear metallic densities are seen in the region of the splenic flexure. There remains approximately 7 linear metallic densities in the expected position of the right colon. IMPRESSION: Multiple curvilinear foreign bodies are again noted in expected position of the colon, with 2 such densities seen in the region of the splenic flexure. Electronically Signed   By: Marijo Conception M.D.   On: 05/19/2019 07:04   Dg Abd 1 View  Result Date: 05/18/2019 CLINICAL DATA:  Progression of foreign bodies. EXAM: ABDOMEN - 1 VIEW COMPARISON:  Radiograph yesterday. FINDINGS: Previous foreign body projecting over the pelvis presumably in the rectum is no longer seen. Eight residual linear foreign bodies in the right mid abdomen, likely in the ascending colon, not significantly progressed from prior. Air-filled nondilated colon. Slight increased small bowel gas without evidence of obstruction. No evidence for free air on this supine view. IMPRESSION: 1. Eight residual linear foreign bodies in the right mid abdomen, likely in the ascending colon, not significantly progressed since yesterday. Previous foreign body in the low pelvis/rectum has passed. 2. Slight increased small bowel gas since yesterday without obstruction. Electronically Signed   By: Keith Rake M.D.   On: 05/18/2019 05:46   Dg Abd 1 View  Result Date: 05/17/2019 CLINICAL DATA:  Progression of ingested foreign bodies. EXAM: ABDOMEN - 1 VIEW COMPARISON:  Most recent radiograph yesterday. FINDINGS: Nine linear foreign bodies persist in the abdomen. Single  foreign body projects over the midline pelvis likely in the rectum. The other foreign bodies are within the right abdomen likely in the ascending colon, with a group of 3 and a group of 5. No evidence of perforation or free air. Decreased colonic stool burden. IMPRESSION: Nine linear foreign bodies persist in the abdomen. One in the midline pelvis in the region of the rectum. Remainder in the right abdomen likely in the ascending colon, with a group of 3 and group of 5. Electronically Signed   By: Keith Rake M.D.   On: 05/17/2019 05:58   Dg Abd 1 View  Result Date: 05/16/2019 CLINICAL DATA:  Foreign body ingestion EXAM: ABDOMEN - 1 VIEW COMPARISON:  Yesterday FINDINGS: High-density foreign bodies are seen scattered along the abdomen, mainly seen at the colon. These are seen from the ascending colon to the level of the rectum. Formed stool is seen throughout the colon. No evidence of bowel obstruction. No concerning mass effect or calcification. IMPRESSION: 1. Metallic foreign bodies distributed along the colon with mild progression since yesterday. The majority remain clustered at the ascending colon. 2. Decreased stool  burden but still diffuse colonic stool. Electronically Signed   By: Marnee Spring M.D.   On: 05/16/2019 04:43   Dg Abd 1 View  Result Date: 05/11/2019 CLINICAL DATA:  Ingestion of radiopaque foreign body. EXAM: ABDOMEN - 1 VIEW COMPARISON:  05/10/2019; 05/09/2019 FINDINGS: Interval increase in number of linear radiopaque foreign bodies, previously 5 radiopaque foreign bodies on the 05/10/2019, 3 were identified on the 05/09/2019 examination, while currently there are 9 radiopaque foreign bodies are identified the majority of which appear to be located within the cecum and ascending colon. Dominant radiopaque foreign body measures approximately 2.4 x 0.2 cm. Nonobstructive bowel gas pattern. Nondiagnostic evaluation for pneumoperitoneum secondary to supine positioning and exclusion  of the lower thorax. No pneumatosis or portal venous gas. No acute osseous abnormalities. Note is made of a small right-sided os acetabuli. IMPRESSION: Interval increase in number of radiopaque foreign bodies, all of which overlie the expected location the cecum and ascending colon. Findings suggestive of continued foreign body ingestion. Electronically Signed   By: Christopher Come M.D.   On: 05/11/2019 07:58   Dg Abd 2 Views  Result Date: 06/05/2019 CLINICAL DATA:  Abdominal pain. Possible foreign body EXAM: ABDOMEN - 2 VIEW COMPARISON:  Abdominal radiograph 06/05/2019 at 7:03 a.m. FINDINGS: There are 2 foreign bodies again identified, both projecting over the sigmoid colon. There is a large amount of stool within the colon. There is no free intraperitoneal gas. Bones are normal. IMPRESSION: 1. 2 foreign bodies, both now in the proximal sigmoid colon. 2.  No free intraperitoneal gas. Electronically Signed   By: Deatra Robinson M.D.   On: 06/05/2019 19:38   Dg Abd 2 Views  Result Date: 06/05/2019 CLINICAL DATA:  Ingestion of foreign body, swallowed a razor blade EXAM: ABDOMEN - 2 VIEW COMPARISON:  06/04/2019 FINDINGS: Two metallic foreign bodies are again identified, located at the proximal ascending and distal descending colon consistent with ingested foreign bodies. The ascending colon foreign body has not significantly changed in position since the prior study. The larger more distal foreign body has moved from the splenic flexure to the distal descending colon since the prior exam. Increased stool throughout colon. Small bowel gas pattern normal. No bowel dilatation, bowel wall thickening, or free air. Lung bases clear. Osseous structures unremarkable. IMPRESSION: Two radiopaque foreign bodies again identified, a smaller foreign body at the proximal ascending colon and a larger foreign body at the distal descending colon which has progressed distally from the splenic flexure since the prior study.  Electronically Signed   By: Ulyses Southward M.D.   On: 06/05/2019 17:52   Dg Abd 2 Views  Result Date: 06/04/2019 CLINICAL DATA:  33 year old male with ingestion of razor bleed. EXAM: ABDOMEN - 2 VIEW COMPARISON:  Abdominal radiograph dated 06/03/2019. FINDINGS: Two radiopaque foreign objects noted, one in the proximal colon/cecum and the other in the distal transverse colon consistent with reported ingested razor blades. The foreign object in the cecum appears relatively in similar position as the prior radiograph. There has been interval distal migration of the more distal radiopaque foreign object from the proximal transverse colon on the prior radiograph to the distal transverse colon on this radiograph. There is moderate stool throughout the colon. No bowel dilatation or evidence of obstruction. No free air or radiopaque calculi. The osseous structures and soft tissues are unremarkable. IMPRESSION: 1. Two radiopaque foreign objects in the colon as described. Interval migration of the more distal foreign object into the distal transverse colon. 2.  No free air or bowel obstruction. Electronically Signed   By: Elgie Collard M.D.   On: 06/04/2019 21:58   Dg Abd 2 Views  Result Date: 06/03/2019 CLINICAL DATA:  Ingestion of foreign body EXAM: ABDOMEN - 2 VIEW COMPARISON:  06/02/2019 FINDINGS: There are two radiopaque foreign bodies identified, both likely within the right colon. Bowel gas pattern is unremarkable. IMPRESSION: Two radiopaque foreign bodies likely within the right colon. Electronically Signed   By: Guadlupe Spanish M.D.   On: 06/03/2019 08:21   Dg Abd 2 Views  Result Date: 06/02/2019 CLINICAL DATA:  Patient swallowed a razor blade. EXAM: ABDOMEN - 2 VIEW COMPARISON:  Earlier film, same date. FINDINGS: Radiopaque foreign body is again demonstrated in the upper abdomen, likely in the antral region of the stomach. The lung bases are clear. Bowel gas pattern is unremarkable. Moderate stool  throughout the colon. No free air. IMPRESSION: Metallic radiopaque foreign body noted in the upper central abdomen, likely in the body or antral region of the stomach. No findings for obstruction or perforation. Electronically Signed   By: Rudie Meyer M.D.   On: 06/02/2019 06:04   Dg Abd 2 Views  Result Date: 05/15/2019 CLINICAL DATA:  Foreign bodies, lower abdominal pain EXAM: ABDOMEN - 2 VIEW COMPARISON:  05/14/2019, 05/13/2019 FINDINGS: Nonobstructive pattern of bowel gas with gas present to the rectum. Multiple linear radiopaque foreign bodies appear to have progressed to the colon, one appears to be within the distal rectum and several within the distal colon. There is a cluster of foreign bodies which appear to be within the patulous transverse colon. No free air in the abdomen. IMPRESSION: 1. Nonobstructive pattern of bowel gas with gas present to the rectum. 2. Multiple linear radiopaque foreign bodies appear to have progressed to the colon, one appears to be within the distal rectum and several within the distal colon. There is a cluster of foreign bodies which appear to be within the patulous transverse colon. Electronically Signed   By: Lauralyn Primes M.D.   On: 05/15/2019 16:28   Dg Abd 2 Views  Result Date: 05/12/2019 CLINICAL DATA:  33 year old male with follow-up of foreign body in the digestive system. EXAM: ABDOMEN - 2 VIEW COMPARISON:  Radiograph dated 05/11/2019. FINDINGS: Multiple linear radiopaque foreign object noted over the abdomen some of which are over the expected location of the cecum and ascending colon and two of which have likely progressed to the distal transverse colon. Several other radiopaque foreign object overlie the pelvis, likely in the region of the rectosigmoid. No definite free air identified. There is no evidence of bowel obstruction. The osseous structures and soft tissues are grossly unremarkable. IMPRESSION: 1. Multiple linear radiopaque foreign objects,  several of which have progressed within the bowel and likely located in the distal transverse colon and in the rectosigmoid. 2. No evidence of bowel perforation or obstruction. Electronically Signed   By: Elgie Collard M.D.   On: 05/12/2019 10:42   Dg Abd 2 Views  Result Date: 05/10/2019 CLINICAL DATA:  Foreign body ingestion. EXAM: ABDOMEN - 2 VIEW COMPARISON:  May 09, 2019. FINDINGS: The bowel gas pattern is normal. There is no evidence of free air. Three linear metallic densities are again noted, 1 on the right side into in the left upper quadrant. These most likely represent ingested foreign bodies within the colon. Also noted are 2 other metallic curvilinear densities overlying the left upper quadrant which were not present on prior exam; is uncertain if  these represent ingested foreign bodies or overlying artifact. IMPRESSION: Three linear metallic densities noted on prior exam are again seen, 1 appears to be in the right colon with the other 2 at the splenic flexure and descending colon. Two new curvilinear metallic densities are seen in the left upper quadrant; it is uncertain if these represent external objects or ingested foreign bodies. Electronically Signed   By: Lupita Raider M.D.   On: 05/10/2019 09:57   Dg Abd 2 Views  Result Date: 05/09/2019 CLINICAL DATA:  Follow-up of ingestion of razor fragments. EXAM: ABDOMEN - 2 VIEW COMPARISON:  Radiographs dated 05/07/2019 and 05/08/2019 FINDINGS: There are 3 metallic foreign bodies in the abdomen. These appear to be in the ascending and transverse portions of the colon,, 1 in the mid ascending colon, 1 near the hepatic flexure and 1 near the splenic flexure. Bowel gas pattern is normal. No free air. Bones are normal. IMPRESSION: 3 metallic foreign bodies in the abdomen as described. The foreign bodies have moved into the colon. Electronically Signed   By: Francene Boyers M.D.   On: 05/09/2019 10:11   Dg Abd Acute W/chest  Result Date:  06/02/2019 CLINICAL DATA:  Patient swallowed razor blade EXAM: DG ABDOMEN ACUTE W/ 1V CHEST COMPARISON:  None. FINDINGS: There is no evidence of dilated bowel loops or free intraperitoneal air. Within the left upper quadrant, likely within a small bowel loop there is a 1.1 cm metallic foreign body. Air and stool seen in nondilated loops of bowel. The lungs are clear. No focal airspace consolidation or pleural effusion. IMPRESSION: 1.1 cm metallic foreign body seen within the left upper quadrant, likely within small bowel loop. Nonobstructive bowel gas pattern. No pneumoperitoneum. No acute cardiopulmonary process Electronically Signed   By: Jonna Clark M.D.   On: 06/02/2019 02:27   Dg Abd Portable 1v  Result Date: 06/08/2019 CLINICAL DATA:  Follow-up ridge ablated ingestion. EXAM: PORTABLE ABDOMEN - 1 VIEW COMPARISON:  06/07/2019 FINDINGS: Bowel gas pattern is nonobstructive with mild fecal retention throughout the colon. The previously seen small metallic foreign body over the mid descending colon has moved distally and is now over the rectosigmoid colon in the midline pelvis. There is a new 1.5 cm metallic foreign body over the right upper quadrant likely over the duodenum compatible with a small screw. No evidence of bowel obstruction or perforation. Remainder of the exam is unchanged. IMPRESSION: 1. Nonobstructive bowel gas pattern with mild fecal retention throughout the colon. 2. previously seen metallic radiopaque foreign body over the mid descending colon has moved distally and is now located over rectosigmoid colon in the midline pelvis. New 1.5 cm metallic screw projects over the right upper quadrant likely over the duodenum. Electronically Signed   By: Elberta Fortis M.D.   On: 06/08/2019 07:46   Dg Abd Portable 1v  Result Date: 06/06/2019 CLINICAL DATA:  33 year old male with a history of foreign body ingestion EXAM: PORTABLE ABDOMEN - 1 VIEW COMPARISON:  06/05/2019, 06/05/2019, 06/04/2019  FINDINGS: Gas within stomach small bowel and colon. Moderate stool burden. No abnormal distension. One metallic radiopaque foreign body identified within the sigmoid colon. The second radiopaque foreign body is not visualized No acute bony abnormality IMPRESSION: Nonobstructive bowel gas pattern. One metallic radiopaque foreign body persist in the sigmoid colon, and the second is no longer visualized. Electronically Signed   By: Gilmer Mor D.O.   On: 06/06/2019 07:52   Dg Abd Portable 2v  Result Date: 05/14/2019 CLINICAL DATA:  Foreign body ingestion. EXAM: PORTABLE ABDOMEN - 2 VIEW COMPARISON:  05/13/2019. FINDINGS: Antegrade progression of ingested radiopaque foreign bodies. There is now a foreign body within the hepatic flexure and mid descending colon. Remaining foreign bodies are noted at the hepatic flexure and mid transverse colon. No abnormal bowel dilatation. IMPRESSION: 1. Findings compatible with progression of ingested radiopaque foreign bodies. 2. Nonobstructive bowel gas pattern. Electronically Signed   By: Signa Kellaylor  Stroud M.D.   On: 05/14/2019 05:31   Dg Abd Portable 2v  Result Date: 05/13/2019 CLINICAL DATA:  Foreign body in digestive system. EXAM: PORTABLE ABDOMEN - 2 VIEW COMPARISON:  05/12/2019 FINDINGS: Supine and left -side up decubitus views. The supine view demonstrates multiple radiopaque foreign objects, now projecting over the right-side of the abdomen. This suggests these are in the ascending colon today and were in distal small bowel loops on the prior. A single radiopaque object projects over the left upper quadrant, likely in the splenic flexure. The pelvis is excluded from the supine view. No bowel obstruction or gross free intraperitoneal air. No significant air-fluid levels on decubitus imaging. IMPRESSION: Radiopaque foreign objects, felt to be within the colon, suggesting progression since yesterday's exam. No evidence of free intraperitoneal air. Electronically Signed    By: Jeronimo GreavesKyle  Talbot M.D.   On: 05/13/2019 07:00       Subjective: Still has some soreness in right abdomen. Tolerating diet. No vomiting. Had bowel movement yesterday  Discharge Exam: Vitals:   06/07/19 0603 06/07/19 1143 06/07/19 2116 06/08/19 0442  BP: 125/89 129/84 129/74 121/85  Pulse: 80 82 74 97  Resp: 16 20 16 18   Temp: 97.7 F (36.5 C) (!) 97.5 F (36.4 C) 98.2 F (36.8 C) 98 F (36.7 C)  TempSrc: Oral Oral Oral Oral  SpO2: 100% 99% 99% 97%  Weight:      Height:        General: Pt is alert, awake, not in acute distress Cardiovascular: RRR, S1/S2 +, no rubs, no gallops Respiratory: CTA bilaterally, no wheezing, no rhonchi Abdominal: Soft, NT, ND, bowel sounds + Extremities: no edema, no cyanosis    The results of significant diagnostics from this hospitalization (including imaging, microbiology, ancillary and laboratory) are listed below for reference.     Microbiology: Recent Results (from the past 240 hour(s))  SARS CORONAVIRUS 2 (TAT 6-24 HRS) Nasopharyngeal Nasopharyngeal Swab     Status: None   Collection Time: 06/02/19  5:43 AM   Specimen: Nasopharyngeal Swab  Result Value Ref Range Status   SARS Coronavirus 2 NEGATIVE NEGATIVE Final    Comment: (NOTE) SARS-CoV-2 target nucleic acids are NOT DETECTED. The SARS-CoV-2 RNA is generally detectable in upper and lower respiratory specimens during the acute phase of infection. Negative results do not preclude SARS-CoV-2 infection, do not rule out co-infections with other pathogens, and should not be used as the sole basis for treatment or other patient management decisions. Negative results must be combined with clinical observations, patient history, and epidemiological information. The expected result is Negative. Fact Sheet for Patients: HairSlick.nohttps://www.fda.gov/media/138098/download Fact Sheet for Healthcare Providers: quierodirigir.comhttps://www.fda.gov/media/138095/download This test is not yet approved or cleared by  the [place] FDA and  has been authorized for detection and/or diagnosis of SARS-CoV-2 by FDA under an Emergency Use Authorization (EUA). This EUA will remain  in effect (meaning this test can be used) for the duration of the COVID-19 declaration under Section 56 4(b)(1) of the Act, 21 U.S.C. section 360bbb-3(b)(1), unless the authorization is terminated or revoked sooner.  Performed at [facility], [address], [place], [place] [address]      Labs: BNP (last 3 results) No results for input(s): BNP in the last 8760 hours. Basic Metabolic Panel: Recent Labs  Lab 06/02/19 0153 06/02/19 0543  NA 138 140  K 3.9 3.6  CL 103 105  CO2 27 27  GLUCOSE 102* 93  BUN 10 8  CREATININE 0.87 0.83  CALCIUM 9.1 8.9   Liver Function Tests: Recent Labs  Lab 06/02/19 0153  AST 18  ALT 11  ALKPHOS 56  BILITOT 1.0  PROT 7.3  ALBUMIN 4.5   No results for input(s): LIPASE, AMYLASE in the last 168 hours. No results for input(s): AMMONIA in the last 168 hours. CBC: Recent Labs  Lab 06/02/19 0153 06/02/19 0543  WBC 8.2 7.2  HGB 16.2 15.4  HCT 45.0 43.2  MCV 84.6 84.5  PLT 289 271   Cardiac Enzymes: No results for input(s): CKTOTAL, CKMB, CKMBINDEX, TROPONINI in the last 168 hours. BNP: Invalid input(s): POCBNP CBG: No results for input(s): GLUCAP in the last 168 hours. D-Dimer No results for input(s): DDIMER in the last 72 hours. Hgb A1c No results for input(s): HGBA1C in the last 72 hours. Lipid Profile No results for input(s): CHOL, HDL, LDLCALC, TRIG, CHOLHDL, LDLDIRECT in the last 72 hours. Thyroid function studies No results for input(s): TSH, T4TOTAL, T3FREE, THYROIDAB in the last 72 hours.  Invalid input(s): FREET3 Anemia work up No results for input(s): VITAMINB12, FOLATE, FERRITIN, TIBC, IRON, RETICCTPCT in the last 72 hours. Urinalysis No results found for: COLORURINE, APPEARANCEUR, LABSPEC, PHURINE, GLUCOSEU, HGBUR, BILIRUBINUR, KETONESUR,  PROTEINUR, UROBILINOGEN, NITRITE, LEUKOCYTESUR Sepsis Labs Invalid input(s): PROCALCITONIN,  WBC,  LACTICIDVEN Microbiology Recent Results (from the past 240 hour(s))  SARS CORONAVIRUS 2 (TAT 6-24 HRS) Nasopharyngeal Nasopharyngeal Swab     Status: None   Collection Time: 06/02/19  5:43 AM   Specimen: Nasopharyngeal Swab  Result Value Ref Range Status   SARS Coronavirus 2 NEGATIVE NEGATIVE Final    Comment: (NOTE) SARS-CoV-2 target nucleic acids are NOT DETECTED. The SARS-CoV-2 RNA is generally detectable in upper and lower respiratory specimens during the acute phase of infection. Negative results do not preclude SARS-CoV-2 infection, do not rule out co-infections with other pathogens, and should not be used as the sole basis for treatment or other patient management decisions. Negative results must be combined with clinical observations, patient history, and epidemiological information. The expected result is Negative. Fact Sheet for Patients: HairSlick.no Fact Sheet for Healthcare Providers: quierodirigir.com This test is not yet approved or cleared by the [place] FDA and  has been authorized for detection and/or diagnosis of SARS-CoV-2 by FDA under an Emergency Use Authorization (EUA). This EUA will remain  in effect (meaning this test can be used) for the duration of the COVID-19 declaration under Section 56 4(b)(1) of the Act, 21 U.S.C. section 360bbb-3(b)(1), unless the authorization is terminated or revoked sooner. Performed at [facility], [address], [place], [place] [address]      Time coordinating discharge:  SIGNED:   Erick Blinks, MD  [facility] 06/08/2019, 9:29 AM   If 7PM-7AM, please contact night-coverage [email or web address]

## 2020-05-26 ENCOUNTER — Encounter (HOSPITAL_COMMUNITY): Payer: Self-pay

## 2020-05-26 ENCOUNTER — Ambulatory Visit (HOSPITAL_COMMUNITY): Admission: EM | Admit: 2020-05-26 | Discharge: 2020-05-26 | Disposition: A | Discharge status: Home / Self Care

## 2020-05-26 ENCOUNTER — Other Ambulatory Visit: Payer: Self-pay

## 2020-05-26 DIAGNOSIS — F331 Major depressive disorder, recurrent, moderate: Secondary | ICD-10-CM

## 2020-05-26 NOTE — ED Notes (Signed)
PATIENT BELONGINGS ARE STORED IN LOCKER 29 °

## 2020-05-26 NOTE — ED Triage Notes (Signed)
Patient arrives via EMS with complaints of opiate withdrawal due to being off suboxone for 3 days. Patient denying SI/HI/AVH. Alert & oriented x4, calm & cooperative. Appears to be having active withdrawal symptoms.

## 2020-05-26 NOTE — ED Notes (Signed)
Patient given AVS and belongings and escorted to lobby. Encouraged to contact pharmacy he last received suboxone from and talk to them about why it is not showing up on the registry as well as get information for prescriber and contact them as well. Patient frustrated but accepting. In no acute distress at time of discharge.
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
## 2020-06-02 ENCOUNTER — Emergency Department (HOSPITAL_COMMUNITY)
Admission: EM | Admit: 2020-06-02 | Discharge: 2020-06-02 | Disposition: A | Discharge status: Home / Self Care | Attending: Emergency Medicine | Admitting: Emergency Medicine

## 2020-06-02 ENCOUNTER — Telehealth (HOSPITAL_COMMUNITY): Payer: Self-pay | Admitting: General Practice

## 2020-06-02 ENCOUNTER — Other Ambulatory Visit: Payer: Self-pay

## 2020-06-02 ENCOUNTER — Encounter (HOSPITAL_COMMUNITY): Payer: Self-pay

## 2020-06-02 DIAGNOSIS — R4189 Other symptoms and signs involving cognitive functions and awareness: Secondary | ICD-10-CM

## 2020-06-02 DIAGNOSIS — R464 Slowness and poor responsiveness: Secondary | ICD-10-CM | POA: Insufficient documentation

## 2020-06-02 NOTE — Discharge Instructions (Addendum)
Today's evaluation has been generally reassuring. Please be sure to follow-up with your physician.  Use alcohol only in moderation, do not use illicit substances.  Return here for concerning changes in your condition.

## 2020-06-02 NOTE — Telephone Encounter (Signed)
Care Management - Follow Up [facility] Discharges   Writer attempted to make contact with patient today and was unsuccessful.  The patient does not have a phone number or an emergency contact listed
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
## 2020-06-04 ENCOUNTER — Emergency Department (HOSPITAL_COMMUNITY)

## 2020-06-04 ENCOUNTER — Encounter (HOSPITAL_COMMUNITY): Payer: Self-pay | Admitting: Emergency Medicine

## 2020-06-04 ENCOUNTER — Emergency Department (HOSPITAL_COMMUNITY)
Admission: EM | Admit: 2020-06-04 | Discharge: 2020-06-05 | Disposition: A | Discharge status: Home / Self Care | Attending: Emergency Medicine | Admitting: Emergency Medicine

## 2020-06-04 ENCOUNTER — Other Ambulatory Visit: Payer: Self-pay

## 2020-06-04 DIAGNOSIS — S92902A Unspecified fracture of left foot, initial encounter for closed fracture: Secondary | ICD-10-CM

## 2020-06-04 DIAGNOSIS — S92512A Displaced fracture of proximal phalanx of left lesser toe(s), initial encounter for closed fracture: Secondary | ICD-10-CM | POA: Insufficient documentation

## 2020-06-04 DIAGNOSIS — Y92002 Bathroom of unspecified non-institutional (private) residence single-family (private) house as the place of occurrence of the external cause: Secondary | ICD-10-CM | POA: Diagnosis not present

## 2020-06-04 DIAGNOSIS — W1839XA Other fall on same level, initial encounter: Secondary | ICD-10-CM | POA: Diagnosis not present

## 2020-06-04 DIAGNOSIS — F17210 Nicotine dependence, cigarettes, uncomplicated: Secondary | ICD-10-CM | POA: Insufficient documentation

## 2020-06-04 DIAGNOSIS — S99922A Unspecified injury of left foot, initial encounter: Secondary | ICD-10-CM | POA: Diagnosis present

## 2020-06-04 NOTE — ED Triage Notes (Signed)
Pt states he fell yesterday on the bathroom and is having pain on his toes and purple color.

## 2020-06-05 MED ORDER — KETOROLAC TROMETHAMINE 60 MG/2ML IM SOLN
30.0000 mg | Freq: Once | INTRAMUSCULAR | Status: AC
  Administered 2020-06-05: 30 mg via INTRAMUSCULAR
  Filled 2020-06-05: qty 2

## 2020-06-05 MED ORDER — ACETAMINOPHEN 500 MG PO TABS
1000.0000 mg | ORAL_TABLET | Freq: Once | ORAL | Status: AC
  Administered 2020-06-05: 1000 mg via ORAL
  Filled 2020-06-05: qty 2
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
## 2020-06-05 NOTE — ED Notes (Signed)
Boot applied to foot

## 2020-06-05 NOTE — Discharge Instructions (Signed)
For pain control you may take at 1000 mg of Tylenol every 8 hours scheduled.    

## 2020-06-06 ENCOUNTER — Other Ambulatory Visit: Payer: Self-pay

## 2020-06-06 ENCOUNTER — Emergency Department (HOSPITAL_COMMUNITY)

## 2020-06-06 ENCOUNTER — Emergency Department (HOSPITAL_COMMUNITY)
Admission: EM | Admit: 2020-06-06 | Discharge: 2020-06-06 | Disposition: A | Discharge status: Home / Self Care | Attending: Emergency Medicine | Admitting: Emergency Medicine

## 2020-06-06 DIAGNOSIS — S80911A Unspecified superficial injury of right knee, initial encounter: Secondary | ICD-10-CM | POA: Diagnosis not present

## 2020-06-06 DIAGNOSIS — W19XXXA Unspecified fall, initial encounter: Secondary | ICD-10-CM | POA: Diagnosis not present

## 2020-06-06 DIAGNOSIS — F17210 Nicotine dependence, cigarettes, uncomplicated: Secondary | ICD-10-CM | POA: Insufficient documentation

## 2020-06-06 DIAGNOSIS — Y9302 Activity, running: Secondary | ICD-10-CM | POA: Diagnosis not present

## 2020-06-06 DIAGNOSIS — S99922A Unspecified injury of left foot, initial encounter: Secondary | ICD-10-CM | POA: Diagnosis present

## 2020-06-06 MED ORDER — IBUPROFEN 600 MG PO TABS: 600.0000 mg | ORAL_TABLET | Freq: Four times a day (QID) | ORAL | 0 refills | Status: AC | PRN

## 2020-06-06 MED ORDER — HYDROCODONE-ACETAMINOPHEN 5-325 MG PO TABS
2.0000 | ORAL_TABLET | Freq: Once | ORAL | Status: AC
  Administered 2020-06-06: 2 via ORAL
  Filled 2020-06-06: qty 2
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
## 2020-06-06 NOTE — Progress Notes (Signed)
Orthopedic Tech Progress Note Patient Details:  Christopher Stephenson. March 24, 1986 [id number]  Ortho Devices Type of Ortho Device: Ace wrap Ortho Device/Splint Location: RLE Ortho Device/Splint Interventions: Ordered, Application   Post Interventions Patient Tolerated: Well Instructions Provided: Care of device   Jennye Moccasin 06/06/2020, 8:36 PM

## 2020-06-06 NOTE — Discharge Instructions (Addendum)
Follow-up with on-call orthopedist Dr. Ranell Patrick.  Call his office to schedule the next available appointment.  The x-ray of your right knee showed you have a small effusion.  You can try wearing an ace wrap or knee compression sleeve.   Wear the boot on your foot to help with pain as well  Ibuprofen 600 mg and 1 tylenol 500 mg for severe pain only. You can do this one time a day, otherwise alterate the medications.  Stop taking the ibuprofen if you have stomach pain or notice that your stool is black.  You can try applying ice to help with pain and swelling  Return to the emergency department for any new or worsening symptoms.

## 2021-07-29 IMAGING — DX DG ABDOMEN 1V
1 series · 1 of 1 positions shown · non-contrast
Comparison: [DATE] [DATE], [DATE] [DATE] p.m.

CLINICAL DATA: Swallowed razor blades, 4 pieces

EXAM:
ABDOMEN - 1 VIEW

[abdomen kub]
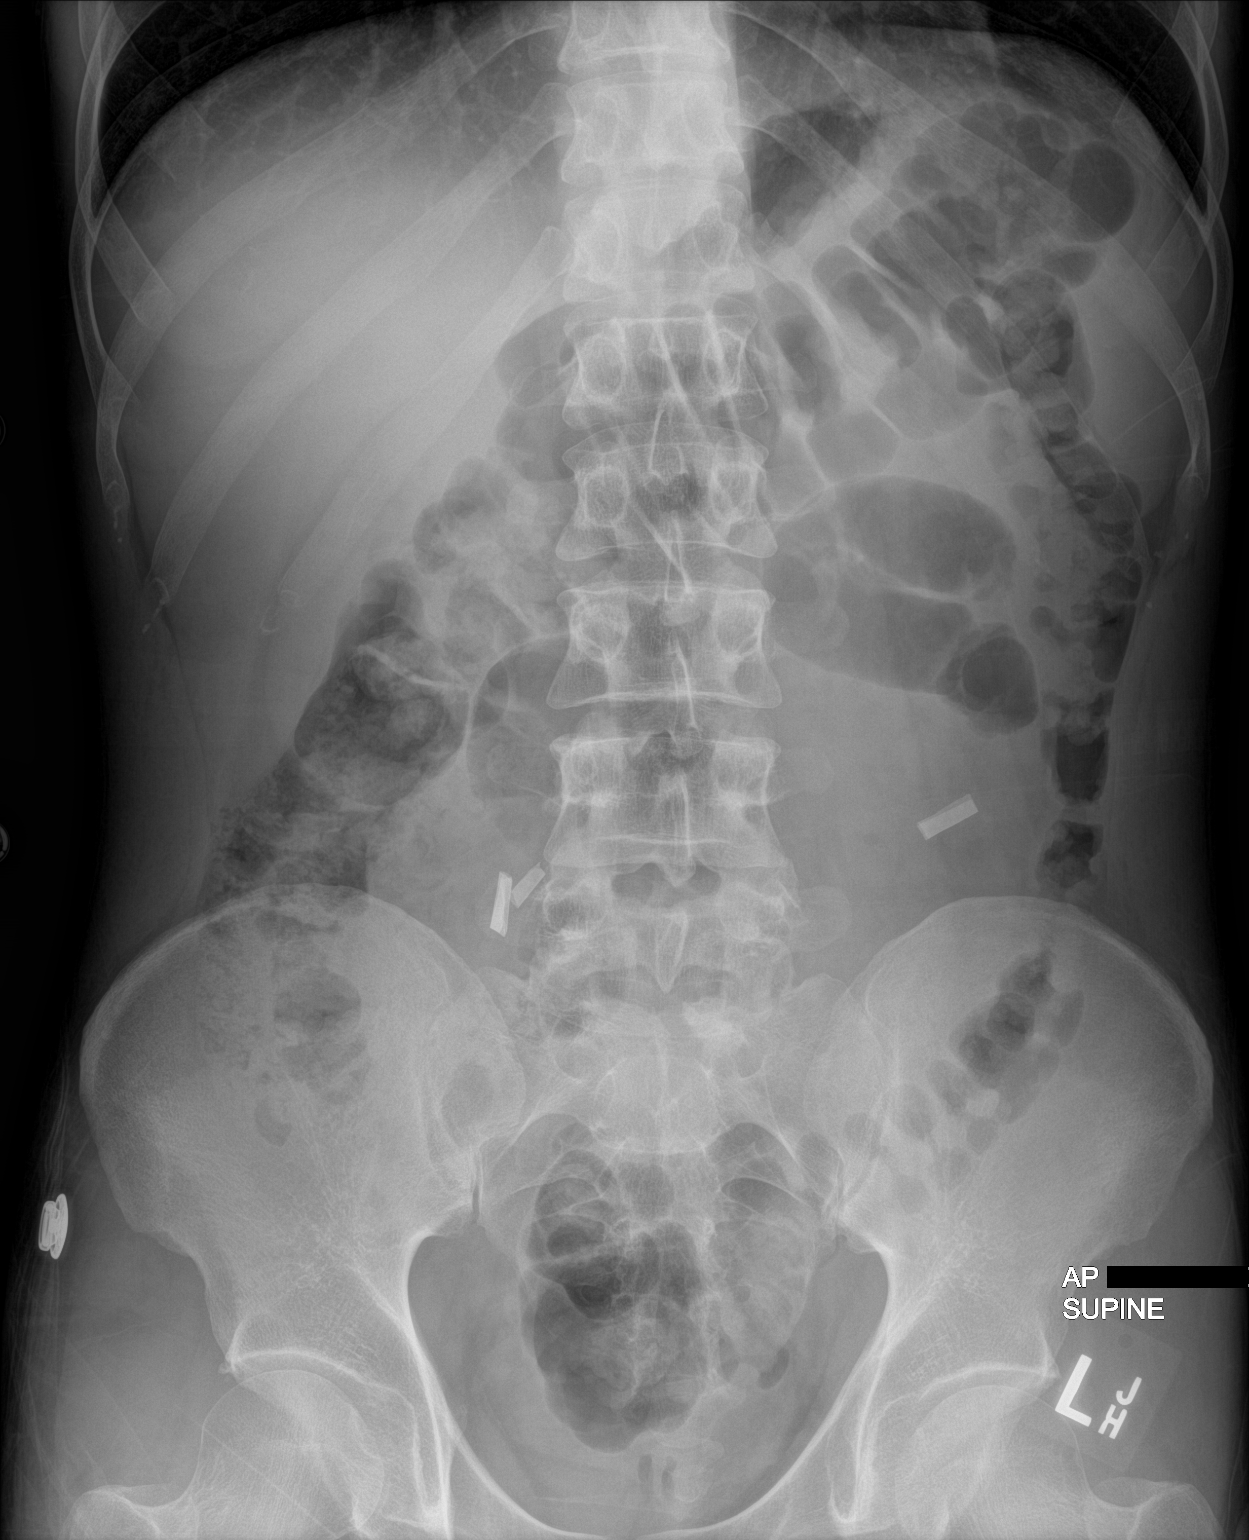

[1 of 1 positions shown; findings below may reference images not displayed]

FINDINGS: Again noted are 3 metallic foreign bodies within the mid abdomen
measuring approximately 1.7 cm each. 1 now projects over the left
mid abdomen likely within bowel loops. Two are seen just to the
right of the L5 vertebral body also likely within bowel. No fourth
foreign body is identified. The colon is air-filled. No acute
osseous abnormality.
IMPRESSION: Three metallic foreign bodies consistent with the patient's history
of ingestion as described above,, 1 within the left mid abdomen and
2 just to the right of the L5 vertebral body.
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
## 2021-07-31 IMAGING — DX DG ABDOMEN 2V
2 series · 2 of 2 positions shown · non-contrast
Comparison: Radiographs dated 05/07/2019 and 05/08/2019

CLINICAL DATA: Follow-up of ingestion of razor fragments.

EXAM:
ABDOMEN - 2 VIEW

[abdomen erect]
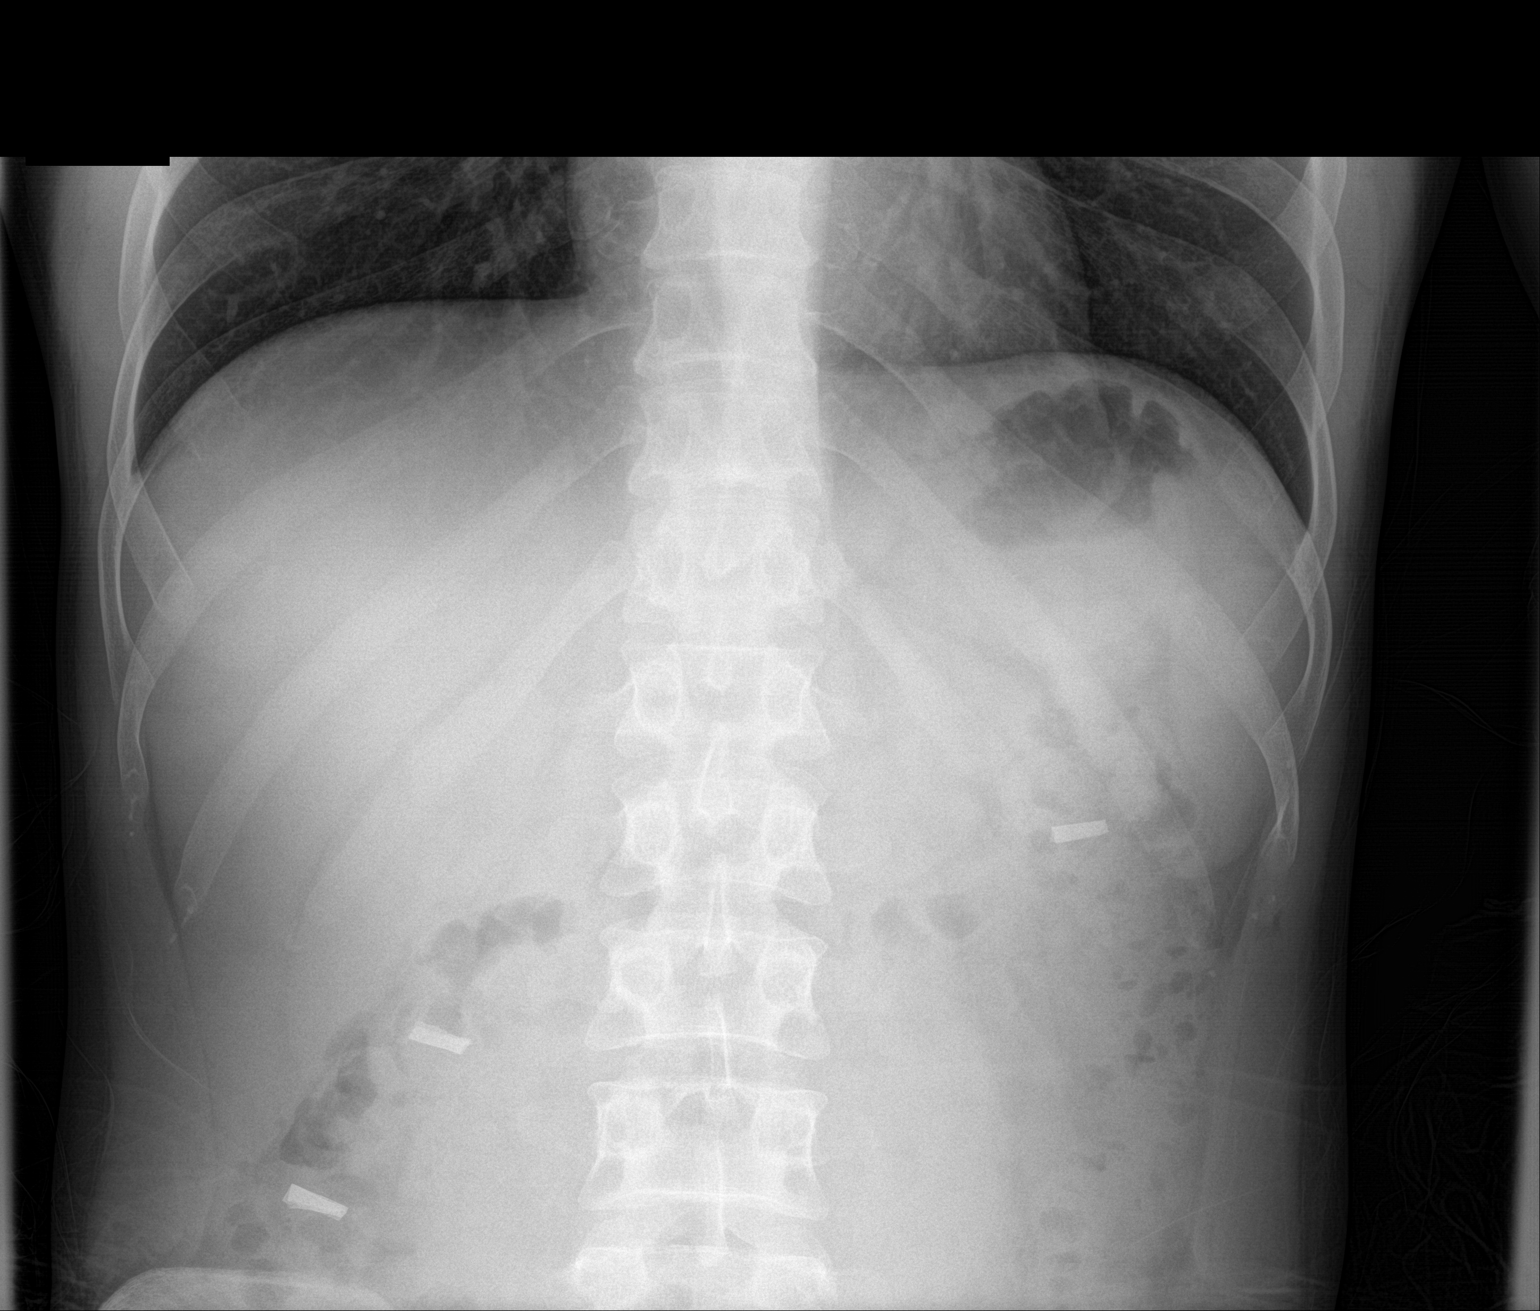

[abdomen supine]
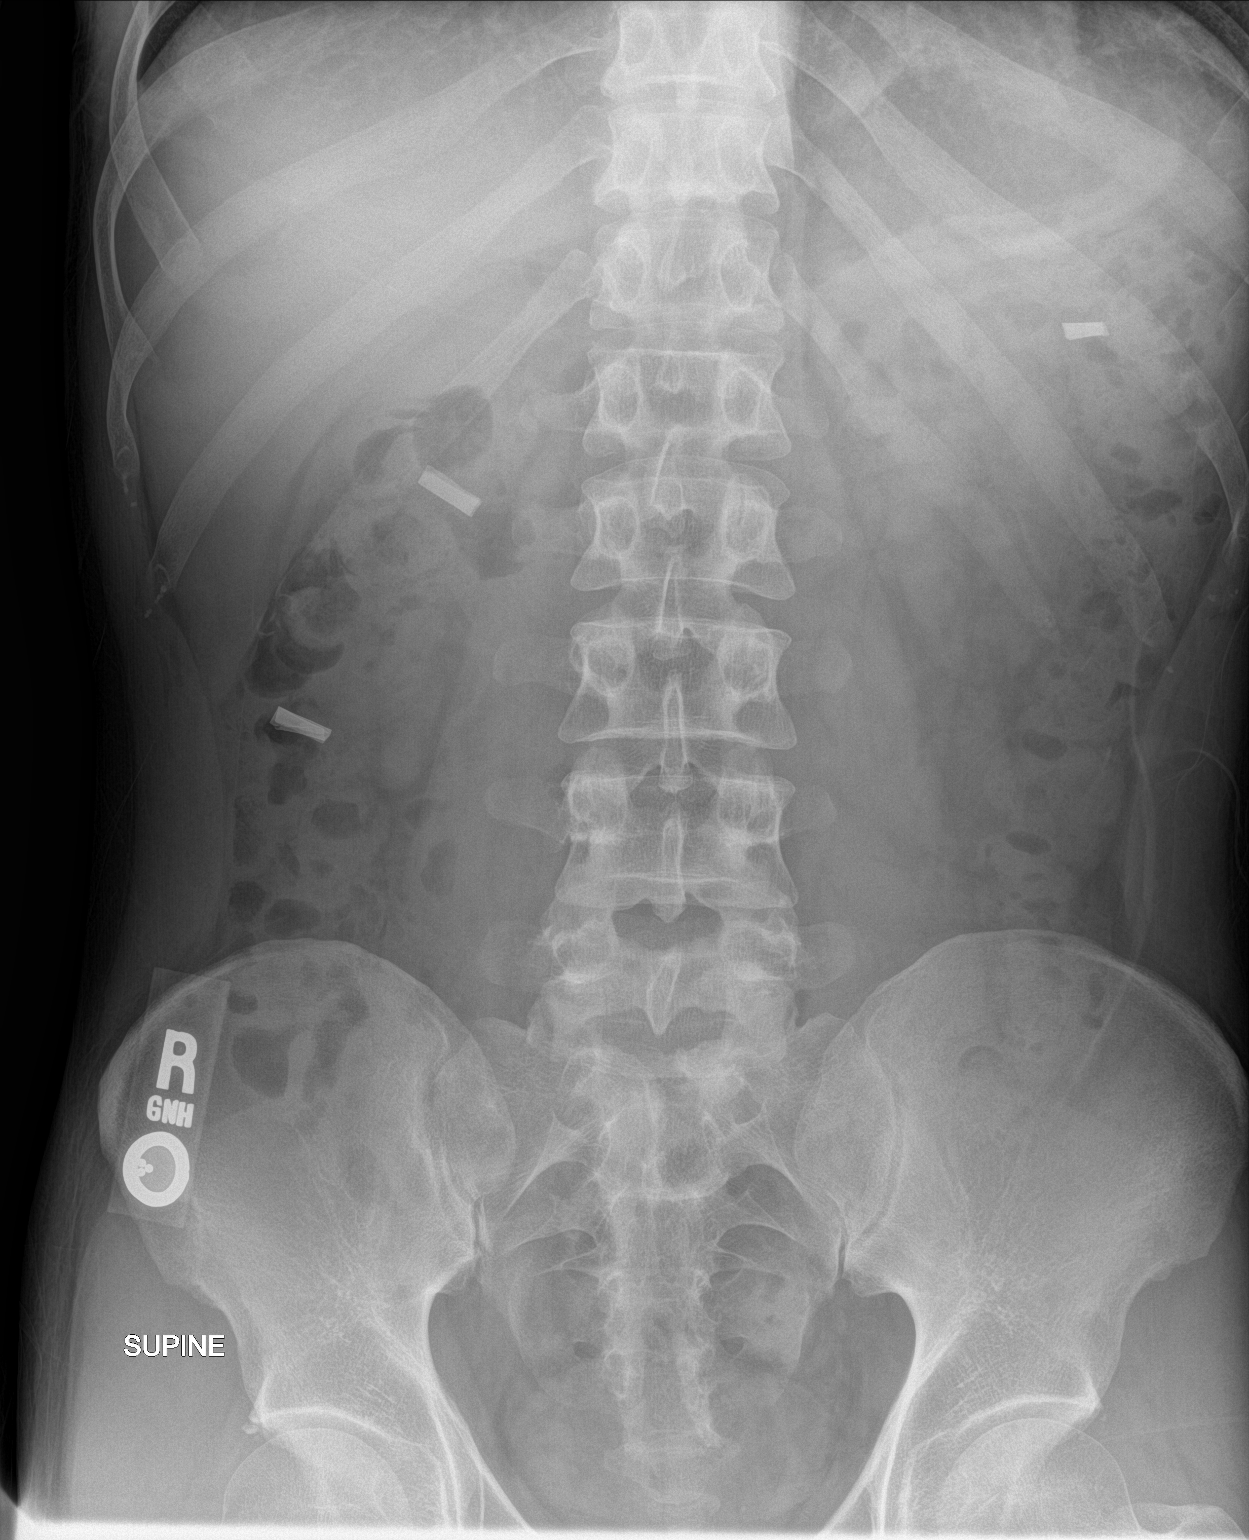

[2 of 2 positions shown; findings below may reference images not displayed]

FINDINGS: There are 3 metallic foreign bodies in the abdomen. These appear to
be in the ascending and transverse portions of the colon,, 1 in the
mid ascending colon, 1 near the hepatic flexure and 1 near the
splenic flexure.

Bowel gas pattern is normal. No free air. Bones are normal.
IMPRESSION: 3 metallic foreign bodies in the abdomen as described. The foreign
bodies have moved into the colon.
